# Patient Record
Sex: Female | Born: 1935 | Race: White | Hispanic: No | Smoking: Former smoker
Health system: Southern US, Community
[De-identification: ages and names within clinical notes are randomized; demographics above are authoritative.]

## PROBLEM LIST (undated history)

## (undated) DIAGNOSIS — R1310 Dysphagia, unspecified: Secondary | ICD-10-CM

## (undated) DIAGNOSIS — I219 Acute myocardial infarction, unspecified: Secondary | ICD-10-CM

## (undated) DIAGNOSIS — F0390 Unspecified dementia without behavioral disturbance: Secondary | ICD-10-CM

## (undated) DIAGNOSIS — K219 Gastro-esophageal reflux disease without esophagitis: Secondary | ICD-10-CM

## (undated) DIAGNOSIS — I10 Essential (primary) hypertension: Secondary | ICD-10-CM

## (undated) DIAGNOSIS — H3530 Unspecified macular degeneration: Secondary | ICD-10-CM

## (undated) DIAGNOSIS — I878 Other specified disorders of veins: Secondary | ICD-10-CM

## (undated) DIAGNOSIS — R32 Unspecified urinary incontinence: Secondary | ICD-10-CM

## (undated) DIAGNOSIS — I2510 Atherosclerotic heart disease of native coronary artery without angina pectoris: Secondary | ICD-10-CM

## (undated) HISTORY — DX: Atherosclerotic heart disease of native coronary artery without angina pectoris: I25.10

## (undated) HISTORY — DX: Gastro-esophageal reflux disease without esophagitis: K21.9

## (undated) HISTORY — DX: Dysphagia, unspecified: R13.10

## (undated) HISTORY — PX: CORONARY ANGIOPLASTY WITH STENT PLACEMENT: SHX49

## (undated) HISTORY — DX: Unspecified dementia without behavioral disturbance: F03.90

---

## 2017-03-28 LAB — BASIC METABOLIC PANEL WITH GFR
BUN: 18 (ref 4–21)
Creatinine: 0.9 (ref 0.5–1.1)
Glucose: 93
Potassium: 4.6 (ref 3.4–5.3)
Sodium: 130 — AB (ref 137–147)

## 2017-03-28 LAB — CBC AND DIFFERENTIAL
HCT: 40 (ref 36–46)
Hemoglobin: 13.4 (ref 12.0–16.0)
Platelets: 231 (ref 150–399)
WBC: 8

## 2017-05-27 ENCOUNTER — Encounter (HOSPITAL_BASED_OUTPATIENT_CLINIC_OR_DEPARTMENT_OTHER): Payer: Self-pay

## 2017-05-27 ENCOUNTER — Emergency Department (HOSPITAL_BASED_OUTPATIENT_CLINIC_OR_DEPARTMENT_OTHER): Payer: Medicare Other

## 2017-05-27 ENCOUNTER — Emergency Department (HOSPITAL_BASED_OUTPATIENT_CLINIC_OR_DEPARTMENT_OTHER)
Admission: EM | Admit: 2017-05-27 | Discharge: 2017-05-27 | Disposition: A | Discharge status: Home / Self Care | Payer: Medicare Other | Source: Home / Self Care | Attending: Emergency Medicine | Admitting: Emergency Medicine

## 2017-05-27 DIAGNOSIS — Z87891 Personal history of nicotine dependence: Secondary | ICD-10-CM | POA: Insufficient documentation

## 2017-05-27 DIAGNOSIS — Z7982 Long term (current) use of aspirin: Secondary | ICD-10-CM

## 2017-05-27 DIAGNOSIS — Z79899 Other long term (current) drug therapy: Secondary | ICD-10-CM | POA: Insufficient documentation

## 2017-05-27 DIAGNOSIS — R4182 Altered mental status, unspecified: Secondary | ICD-10-CM

## 2017-05-27 DIAGNOSIS — N390 Urinary tract infection, site not specified: Secondary | ICD-10-CM | POA: Insufficient documentation

## 2017-05-27 DIAGNOSIS — R05 Cough: Secondary | ICD-10-CM

## 2017-05-27 DIAGNOSIS — I10 Essential (primary) hypertension: Secondary | ICD-10-CM

## 2017-05-27 DIAGNOSIS — A419 Sepsis, unspecified organism: Secondary | ICD-10-CM | POA: Diagnosis not present

## 2017-05-27 HISTORY — DX: Essential (primary) hypertension: I10

## 2017-05-27 HISTORY — DX: Acute myocardial infarction, unspecified: I21.9

## 2017-05-27 HISTORY — DX: Unspecified macular degeneration: H35.30

## 2017-05-27 HISTORY — DX: Unspecified urinary incontinence: R32

## 2017-05-27 LAB — COMPREHENSIVE METABOLIC PANEL
ALBUMIN: 3.9 g/dL (ref 3.5–5.0)
ALK PHOS: 82 U/L (ref 38–126)
ALT: 17 U/L (ref 14–54)
ANION GAP: 9 (ref 5–15)
AST: 27 U/L (ref 15–41)
BUN: 18 mg/dL (ref 6–20)
CO2: 24 mmol/L (ref 22–32)
Calcium: 10 mg/dL (ref 8.9–10.3)
Chloride: 98 mmol/L — ABNORMAL LOW (ref 101–111)
Creatinine, Ser: 0.77 mg/dL (ref 0.44–1.00)
GFR calc Af Amer: >60 mL/min (ref >60)
GLUCOSE: 141 mg/dL — AB (ref 65–99)
Potassium: 4.3 mmol/L (ref 3.5–5.1)
Sodium: 131 mmol/L — ABNORMAL LOW (ref 135–145)
Total Bilirubin: 0.5 mg/dL (ref 0.3–1.2)
Total Protein: 7.3 g/dL (ref 6.5–8.1)

## 2017-05-27 LAB — CBC WITH DIFFERENTIAL/PLATELET
BASOS PCT: 0 %
Basophils Absolute: 0 10*3/uL (ref 0.0–0.1)
Eosinophils Absolute: 0 10*3/uL (ref 0.0–0.7)
Eosinophils Relative: 0 %
HEMATOCRIT: 41.2 % (ref 36.0–46.0)
HEMOGLOBIN: 14.3 g/dL (ref 12.0–15.0)
LYMPHS PCT: 9 %
Lymphs Abs: 1 10*3/uL (ref 0.7–4.0)
MCH: 32.1 pg (ref 26.0–34.0)
MCHC: 34.7 g/dL (ref 30.0–36.0)
MCV: 92.4 fL (ref 78.0–100.0)
MONO ABS: 1.1 10*3/uL — AB (ref 0.1–1.0)
MONOS PCT: 10 %
NEUTROS ABS: 9 10*3/uL — AB (ref 1.7–7.7)
NEUTROS PCT: 81 %
Platelets: 180 10*3/uL (ref 150–400)
RBC: 4.46 MIL/uL (ref 3.87–5.11)
RDW: 13.6 % (ref 11.5–15.5)
WBC: 11.1 10*3/uL — ABNORMAL HIGH (ref 4.0–10.5)

## 2017-05-27 LAB — URINALYSIS, MICROSCOPIC (REFLEX)

## 2017-05-27 LAB — URINALYSIS, ROUTINE W REFLEX MICROSCOPIC
BILIRUBIN URINE: NEGATIVE
GLUCOSE, UA: NEGATIVE mg/dL
KETONES UR: 15 mg/dL — AB
Nitrite: POSITIVE — AB
Protein, ur: 100 mg/dL — AB
Specific Gravity, Urine: 1.019 (ref 1.005–1.030)
pH: 5.5 (ref 5.0–8.0)

## 2017-05-27 LAB — I-STAT CG4 LACTIC ACID, ED: LACTIC ACID, VENOUS: 0.75 mmol/L (ref 0.5–1.9)

## 2017-05-27 MED ORDER — IBUPROFEN 400 MG PO TABS
400.0000 mg | ORAL_TABLET | Freq: Once | ORAL | Status: AC
  Administered 2017-05-27: 400 mg via ORAL

## 2017-05-27 MED ORDER — CEPHALEXIN 500 MG PO CAPS: 500.0000 mg | ORAL_CAPSULE | Freq: Two times a day (BID) | ORAL | 0 refills | Status: DC

## 2017-05-27 MED ORDER — IBUPROFEN 400 MG PO TABS
ORAL_TABLET | ORAL | Status: AC
  Filled 2017-05-27: qty 1

## 2017-05-27 MED ORDER — ACETAMINOPHEN 325 MG PO TABS
650.0000 mg | ORAL_TABLET | Freq: Once | ORAL | Status: AC
  Administered 2017-05-27: 650 mg via ORAL
  Filled 2017-05-27: qty 2

## 2017-05-27 MED ORDER — DEXTROSE 5 % IV SOLN
1.0000 g | Freq: Once | INTRAVENOUS | Status: AC
  Administered 2017-05-27: 1 g via INTRAVENOUS
  Filled 2017-05-27: qty 10

## 2017-05-27 MED ORDER — SODIUM CHLORIDE 0.9 % IV BOLUS (SEPSIS)
500.0000 mL | Freq: Once | INTRAVENOUS | Status: AC
  Administered 2017-05-27: 500 mL via INTRAVENOUS

## 2017-05-27 NOTE — Discharge Instructions (Signed)
Please read and follow all provided instructions.  Your diagnoses today include:  1. Urinary tract infection without hematuria, site unspecified    Tests performed today include: Urine test - suggests that you have an infection in your bladder Vital signs. See below for your results today.   Medications prescribed:  Take as prescribed   Home care instructions:  Follow any educational materials contained in this packet.  Follow-up instructions: Please follow-up with your primary care provider in 3 days if symptoms are not resolved for further evaluation of your symptoms.  Return instructions:  Please return to the Emergency Department if you experience worsening symptoms.  Return with fever, worsening pain, persistent vomiting, worsening pain in your back.  Please return if you have any other emergent concerns.  Additional Information:  Your vital signs today were: BP (!) 125/52 (BP Location: Left Arm)    Pulse 78    Temp 99.5 F (37.5 C) (Oral)    Resp 20    Ht 5\' 2"  (1.575 m)    Wt 54.4 kg (120 lb)    SpO2 95%    BMI 21.95 kg/m  If your blood pressure (BP) was elevated above 135/85 this visit, please have this repeated by your doctor within one month. --------------

## 2017-05-27 NOTE — ED Notes (Signed)
ED Provider at bedside. 

## 2017-05-27 NOTE — ED Triage Notes (Signed)
Per grandson pt with confusion last night and today-pt A/O x 3-NAD-states pt with recent viral infection/prod cough-took mucinex-no abx-presents to triage in own w/c scooter

## 2017-05-27 NOTE — ED Notes (Signed)
ED Provider at bedside, Dr. Belfi 

## 2017-05-27 NOTE — ED Provider Notes (Signed)
[facility]   CSN: [id number] Arrival date & time: 05/27/17  2056  By signing my name below, I, Sara Clark, attest that this documentation has been prepared under the direction and in the presence of Audry Piliyler Emmajo Bennette, PA-C. Electronically Signed: Karren CobbleNy'Kea Clark, ED Scribe. 05/27/17. 10:43 PM.  History   Chief Complaint Chief Complaint  Patient presents with  . Altered Mental Status   The history is provided by the patient and a relative. No language interpreter was used.    HPI Comments: Sara Clark is a 83 y.o. female with a PMHx of HTN and MI, who presents to the Emergency Department complaining of sudden onset of confusion that started last night. Pt notes associated lower quadrant abdominal pain and a cough (white sputum production). Per pt's grandson she has had a viral infection they have been treating with Mucinex, which her symptoms were improving. Last night when speaking with her he notes she seemed to be slightly confused. She also reports to him she slid when trying to get out of the bed and was unable to get herself up. She was assisted up by the staff. At this moment she notes she is not feeling well. Denies dysuria, back pain, or any other acute associated symptoms at this time.    Past Medical History:  Diagnosis Date  . Hypertension   . Macular degeneration   . Myocardial infarct (HCC)   . Urinary incontinence     There are no active problems to display for this patient.   Past Surgical History:  Procedure Laterality Date  . CORONARY ANGIOPLASTY WITH STENT PLACEMENT      OB History    No data available       Home Medications    Prior to Admission medications   Medication Sig Start Date End Date Taking? Authorizing Provider  amLODipine (NORVASC) 5 MG tablet Take 5 mg by mouth daily.   Yes [provider]  aspirin EC 81 MG tablet Take 81 mg by mouth every 3 (three) days.   Yes [provider]  atenolol (TENORMIN)  25 MG tablet Take by mouth 2 (two) times daily.   Yes [provider]  oxybutynin (DITROPAN) 5 MG tablet Take 5 mg by mouth 2 (two) times daily.   Yes [provider]    Family History No family history on file.  Social History Social History  Substance Use Topics  . Smoking status: Former Games developermoker  . Smokeless tobacco: Never Used  . Alcohol use No     Allergies   Erythromycin and Penicillins   Review of Systems Review of Systems  Constitutional:       Unusual confusion.  Respiratory: Positive for cough.   Gastrointestinal: Positive for abdominal pain.  Genitourinary: Negative for dysuria.  Musculoskeletal: Negative for back pain.   A complete 10 system review of systems was obtained and all systems are negative except as noted in the HPI and PMH.   Physical Exam Updated Vital Signs BP (!) 142/48 (BP Location: Right Arm)   Pulse 79   Temp (!) 100.9 F (38.3 C) (Oral)   Resp 16   Ht 5\' 2"  (1.575 m)   Wt 120 lb (54.4 kg)   SpO2 96%   BMI 21.95 kg/m   Physical Exam  Constitutional: She is oriented to person, place, and time. She appears well-developed and well-nourished. No distress.  HENT:  Head: Normocephalic and atraumatic.  Right Ear: Tympanic membrane, external ear and ear canal  normal.  Left Ear: Tympanic membrane, external ear and ear canal normal.  Nose: Nose normal.  Mouth/Throat: Uvula is midline, oropharynx is clear and moist and mucous membranes are normal. No trismus in the jaw. No oropharyngeal exudate, posterior oropharyngeal erythema or tonsillar abscesses.  Eyes: EOM are normal. Pupils are equal, round, and reactive to light.  Neck: Normal range of motion. Neck supple. No tracheal deviation present.  Cardiovascular: Normal rate, regular rhythm, S1 normal, S2 normal, normal heart sounds, intact distal pulses and normal pulses.   Pulmonary/Chest: Effort normal and breath sounds normal. No respiratory distress. She has no decreased  breath sounds. She has no wheezes. She has no rhonchi. She has no rales.  Abdominal: Soft. Normal appearance and bowel sounds are normal. She exhibits no distension. There is tenderness.  Tenderness with palpation of the suprapubic region.   Musculoskeletal: Normal range of motion.  Neurological: She is alert and oriented to person, place, and time.  Skin: Skin is warm and dry.  Psychiatric: She has a normal mood and affect. Her speech is normal and behavior is normal. Thought content normal.  Nursing note and vitals reviewed.   ED Treatments / Results  DIAGNOSTIC STUDIES: Oxygen Saturation is 96% on RA, adequate by my interpretation.   COORDINATION OF CARE: 10:25 PM-Discussed next steps with pt. Pt verbalized understanding and is agreeable with the plan.   Labs (all labs ordered are listed, but only abnormal results are displayed) Labs Reviewed  CBC WITH DIFFERENTIAL/PLATELET - Abnormal; Notable for the following:       Result Value   WBC 11.1 (*)    Neutro Abs 9.0 (*)    Monocytes Absolute 1.1 (*)    All other components within normal limits  COMPREHENSIVE METABOLIC PANEL - Abnormal; Notable for the following:    Sodium 131 (*)    Chloride 98 (*)    Glucose, Bld 141 (*)    All other components within normal limits  URINALYSIS, ROUTINE W REFLEX MICROSCOPIC - Abnormal; Notable for the following:    APPearance TURBID (*)    Hgb urine dipstick MODERATE (*)    Ketones, ur 15 (*)    Protein, ur 100 (*)    Nitrite POSITIVE (*)    Leukocytes, UA LARGE (*)    All other components within normal limits  URINALYSIS, MICROSCOPIC (REFLEX) - Abnormal; Notable for the following:    Bacteria, UA MANY (*)    Squamous Epithelial / LPF 0-5 (*)    All other components within normal limits  URINE CULTURE  I-STAT CG4 LACTIC ACID, ED    EKG  EKG Interpretation None       Radiology Dg Chest 2 View  Result Date: 05/27/2017 CLINICAL DATA:  Cough. EXAM: CHEST  2 VIEW COMPARISON:   None. FINDINGS: The heart size and mediastinal contours are within normal limits. Atherosclerosis of thoracic aorta is noted. No pneumothorax or pleural effusion is noted. Mild bibasilar atelectasis or scarring is noted. The visualized skeletal structures are unremarkable. IMPRESSION: Aortic atherosclerosis. Mild bibasilar subsegmental atelectasis or scarring. Electronically Signed   By: Lupita Raider, M.D.   On: 05/27/2017 21:49    Procedures Procedures (including critical care time)  Medications Ordered in ED Medications  ibuprofen (ADVIL,MOTRIN) tablet 400 mg (400 mg Oral Given 05/27/17 2219)  acetaminophen (TYLENOL) tablet 650 mg (650 mg Oral Given 05/27/17 2247)  sodium chloride 0.9 % bolus 500 mL (0 mLs Intravenous Stopped 05/27/17 2327)  cefTRIAXone (ROCEPHIN) 1 g in dextrose 5 %  50 mL IVPB (0 g Intravenous Stopped 05/27/17 2326)   Initial Impression / Assessment and Plan / ED Course  I have reviewed the triage vital signs and the nursing notes.  Pertinent labs & imaging results that were available during my care of the patient were reviewed by me and considered in my medical decision making (see chart for details).  Final Clinical Impressions(s) / ED Diagnoses  I have reviewed and evaluated the relevant laboratory values I have reviewed and evaluated the relevant imaging studies.  I have reviewed the relevant previous healthcare records. I obtained HPI from historian. Patient discussed with supervising physician  ED Course:  Assessment: Pt is a 83 y.o. female with hx  HTN and MI who presents with reported confusion as well as generalized weakness. Notes occurrence yesterday. Per patient, fighting viral URI with family who has similar. No N/V. No CP/SOB. Endorse ABD pain suprapubic region. No dysuria. On exam, pt in Nontoxic/nonseptic appearing. VSS. Temp 100.53F. Lungs CTA. Heart RRR. Abdomen TTP suprapubiclly. iStat Lactate 0.75. WBC 11.1. CXR unremarkable. UA shows UTI. Culture sent.  Given Rocephin in ED as well as 500 NS bolus. Seen by supervising physician. Will DC home with Dx UTI. Given Keflex. Strict return precautions given. Pt with son who is radiologist and is ok for DC. Will watch at home. Plan is to DC home. At time of discharge, Patient is in no acute distress. Vital Signs are stable. Patient is able to ambulate. Patient able to tolerate PO.   Disposition/Plan:  DC Home Additional Verbal discharge instructions given and discussed with patient.  Pt Instructed to f/u with PCP in the next week for evaluation and treatment of symptoms. Return precautions given Pt acknowledges and agrees with plan  Supervising Physician Rolan Bucco, MD   Final diagnoses:  Urinary tract infection without hematuria, site unspecified    New Prescriptions New Prescriptions   No medications on file   I personally performed the services described in this documentation, which was scribed in my presence. The recorded information has been reviewed and is accurate.    Audry Pili, PA-C 05/27/17 2332    Rolan Bucco, MD 05/27/17 [phone]

## 2017-05-28 ENCOUNTER — Inpatient Hospital Stay (HOSPITAL_COMMUNITY)
Admission: EM | Admit: 2017-05-28 | Discharge: 2017-06-03 | DRG: 871 | Disposition: A | Discharge status: Skilled Nursing Facility | Payer: Medicare Other | Attending: Internal Medicine | Admitting: Internal Medicine

## 2017-05-28 DIAGNOSIS — Z66 Do not resuscitate: Secondary | ICD-10-CM | POA: Diagnosis present

## 2017-05-28 DIAGNOSIS — I2510 Atherosclerotic heart disease of native coronary artery without angina pectoris: Secondary | ICD-10-CM | POA: Diagnosis present

## 2017-05-28 DIAGNOSIS — R0602 Shortness of breath: Secondary | ICD-10-CM

## 2017-05-28 DIAGNOSIS — J189 Pneumonia, unspecified organism: Secondary | ICD-10-CM | POA: Diagnosis present

## 2017-05-28 DIAGNOSIS — Z87891 Personal history of nicotine dependence: Secondary | ICD-10-CM

## 2017-05-28 DIAGNOSIS — J181 Lobar pneumonia, unspecified organism: Secondary | ICD-10-CM

## 2017-05-28 DIAGNOSIS — Z6824 Body mass index (BMI) 24.0-24.9, adult: Secondary | ICD-10-CM

## 2017-05-28 DIAGNOSIS — G9341 Metabolic encephalopathy: Secondary | ICD-10-CM | POA: Diagnosis present

## 2017-05-28 DIAGNOSIS — Z79899 Other long term (current) drug therapy: Secondary | ICD-10-CM

## 2017-05-28 DIAGNOSIS — G9349 Other encephalopathy: Secondary | ICD-10-CM

## 2017-05-28 DIAGNOSIS — J690 Pneumonitis due to inhalation of food and vomit: Secondary | ICD-10-CM | POA: Diagnosis present

## 2017-05-28 DIAGNOSIS — K8020 Calculus of gallbladder without cholecystitis without obstruction: Secondary | ICD-10-CM | POA: Diagnosis present

## 2017-05-28 DIAGNOSIS — I252 Old myocardial infarction: Secondary | ICD-10-CM

## 2017-05-28 DIAGNOSIS — E440 Moderate protein-calorie malnutrition: Secondary | ICD-10-CM | POA: Diagnosis present

## 2017-05-28 DIAGNOSIS — N3000 Acute cystitis without hematuria: Secondary | ICD-10-CM

## 2017-05-28 DIAGNOSIS — R5381 Other malaise: Secondary | ICD-10-CM

## 2017-05-28 DIAGNOSIS — K224 Dyskinesia of esophagus: Secondary | ICD-10-CM | POA: Diagnosis present

## 2017-05-28 DIAGNOSIS — B999 Unspecified infectious disease: Secondary | ICD-10-CM

## 2017-05-28 DIAGNOSIS — A419 Sepsis, unspecified organism: Principal | ICD-10-CM | POA: Diagnosis present

## 2017-05-28 DIAGNOSIS — Z955 Presence of coronary angioplasty implant and graft: Secondary | ICD-10-CM

## 2017-05-28 DIAGNOSIS — H3530 Unspecified macular degeneration: Secondary | ICD-10-CM | POA: Diagnosis present

## 2017-05-28 DIAGNOSIS — I10 Essential (primary) hypertension: Secondary | ICD-10-CM | POA: Diagnosis present

## 2017-05-28 DIAGNOSIS — Z7982 Long term (current) use of aspirin: Secondary | ICD-10-CM

## 2017-05-28 DIAGNOSIS — I159 Secondary hypertension, unspecified: Secondary | ICD-10-CM

## 2017-05-28 DIAGNOSIS — R109 Unspecified abdominal pain: Secondary | ICD-10-CM | POA: Diagnosis present

## 2017-05-28 DIAGNOSIS — R911 Solitary pulmonary nodule: Secondary | ICD-10-CM | POA: Diagnosis present

## 2017-05-28 DIAGNOSIS — N390 Urinary tract infection, site not specified: Secondary | ICD-10-CM | POA: Diagnosis present

## 2017-05-28 DIAGNOSIS — K219 Gastro-esophageal reflux disease without esophagitis: Secondary | ICD-10-CM | POA: Diagnosis present

## 2017-05-28 DIAGNOSIS — B9620 Unspecified Escherichia coli [E. coli] as the cause of diseases classified elsewhere: Secondary | ICD-10-CM | POA: Diagnosis present

## 2017-05-28 HISTORY — DX: Other specified disorders of veins: I87.8

## 2017-05-28 LAB — I-STAT CG4 LACTIC ACID, ED: Lactic Acid, Venous: 0.94 mmol/L (ref 0.5–1.9)

## 2017-05-28 NOTE — ED Notes (Signed)
Bed: [id number] Expected date:  Expected time:  Means of arrival:  Comments: [id number] yo F/ Abd pain-uti

## 2017-05-28 NOTE — ED Triage Notes (Signed)
Per EMS pt has complaints of lower abdominal pain which started a few hours ago. Pt has Hx of UTI. [facility], started antibiotics today. Pt family reports increased confusion.

## 2017-05-28 NOTE — ED Provider Notes (Signed)
[facility]-EM[facility]ENCY DEPT Provider Note   CSN: [id number] Arrival date & time: 05/28/17  2212   By signing my name below, I, [name], attest that this documentation has been prepared under the direction and in the presence of [employer], PA-C. Electronically Signed: Karren Cobble, ED Scribe. 05/28/17. 11:13 PM.  History   Chief Complaint Chief Complaint  Patient presents with  . Abdominal Pain   The history is provided by the patient and a relative. No language interpreter was used.    HPI Comments: Sara Clark is a 83 y.o. female brought in by ambulance, who presents to the Emergency Department complaining of gradually worsening generalized weakness. Notes a productive cough, and intermittent left lower quadrant abdominal pain. She has been coughing up yellow sputum today but previously she was producing white sputum. Pt was seen in the ED yesterday with similar symptoms with her grandson, and she was dx with a UTI and given an abx. Today her grandson notes she has continued to decline from her normal baseline. She has been generally weaker and unable to ambulate on her own. Pt denies having a bowel movement today. Denies nausea, vomiting, or fever.  Past Medical History:  Diagnosis Date  . Hypertension   . Macular degeneration   . Myocardial infarct (HCC)   . Urinary incontinence     There are no active problems to display for this patient.   Past Surgical History:  Procedure Laterality Date  . CORONARY ANGIOPLASTY WITH STENT PLACEMENT      OB History    No data available       Home Medications    Prior to Admission medications   Medication Sig Start Date End Date Taking? Authorizing Provider  amLODipine (NORVASC) 5 MG tablet Take 5 mg by mouth daily.   Yes [provider]  aspirin EC 81 MG tablet Take 81 mg by mouth every 3 (three) days.   Yes [provider]  atenolol (TENORMIN) 25 MG tablet Take by mouth 2 (two) times daily.   Yes [provider]  cephALEXin (KEFLEX) 500 MG capsule Take 1 capsule (500 mg total) by mouth 2 (two) times daily. 05/27/17 06/03/17 Yes Audry Pili, PA-C  Multiple Vitamin (MULTIVITAMIN WITH MINERALS) TABS tablet Take 1 tablet by mouth daily.   Yes [provider]  multivitamin-lutein (OCUVITE-LUTEIN) CAPS capsule Take 1 capsule by mouth daily.   Yes [provider]  oxybutynin (DITROPAN) 5 MG tablet Take 5 mg by mouth 2 (two) times daily.   Yes [provider]  senna (SENOKOT) 8.6 MG TABS tablet Take 1 tablet by mouth 2 (two) times daily.   Yes [provider]    Family History No family history on file.  Social History Social History  Substance Use Topics  . Smoking status: Former Games developer  . Smokeless tobacco: Never Used  . Alcohol use No     Allergies   Erythromycin and Penicillins   Review of Systems Review of Systems  Constitutional: Negative for fever.  Gastrointestinal: Positive for abdominal pain. Negative for nausea and vomiting.  Neurological: Positive for weakness.  A complete 10 system review of systems was obtained and all systems are negative except as noted in the HPI and PMH.    Physical Exam Updated Vital Signs BP (!) 133/105 (BP Location: Right Arm)   Pulse 73   Temp 99.2 F (37.3 C) (Oral)   Resp 18   SpO2 92%   Physical Exam  Constitutional: She is oriented  to person, place, and time. She appears well-developed and well-nourished. No distress.  Nontoxic and in no acute distress  HENT:  Head: Normocephalic and atraumatic.  Eyes: Conjunctivae and EOM are normal. No scleral icterus.  Neck: Normal range of motion.  No meningismus  Cardiovascular: Normal rate, regular rhythm and intact distal pulses.   Pulmonary/Chest: Effort normal. No respiratory distress. She has no wheezes.  Diffuse expiratory rhonchi, worse in bilateral lower lobes. Faint wheeze in the anterior right lower lobe. Chest expansion symmetric.  Abdominal:  Soft. She exhibits no distension. There is no guarding.  Minimal left lower quadrant tenderness. No abdominal distention. No masses or peritoneal signs. Abdomen soft.  Musculoskeletal: Normal range of motion.  Neurological: She is alert and oriented to person, place, and time. She exhibits normal muscle tone. Coordination normal.  GCS 15. Speech is goal oriented. Patient answers questions appropriately and follows commands.   Skin: Skin is warm and dry. No rash noted. She is not diaphoretic. No erythema. No pallor.  Psychiatric: She has a normal mood and affect. Her behavior is normal.  Nursing note and vitals reviewed.    ED Treatments / Results  DIAGNOSTIC STUDIES: Oxygen Saturation is 92% on RA, low by my interpretation.   COORDINATION OF CARE: 10:49 PM-Discussed next steps with pt. Pt verbalized understanding and is agreeable with the plan.   Labs (all labs ordered are listed, but only abnormal results are displayed) Labs Reviewed  CBC WITH DIFFERENTIAL/PLATELET - Abnormal; Notable for the following:       Result Value   WBC 14.3 (*)    Neutro Abs 11.9 (*)    Monocytes Absolute 1.1 (*)    All other components within normal limits  COMPREHENSIVE METABOLIC PANEL - Abnormal; Notable for the following:    Sodium 131 (*)    Chloride 100 (*)    Glucose, Bld 122 (*)    Albumin 3.3 (*)    All other components within normal limits  CULTURE, BLOOD (ROUTINE X 2)  CULTURE, BLOOD (ROUTINE X 2)  CULTURE, EXPECTORATED SPUTUM-ASSESSMENT  I-STAT CG4 LACTIC ACID, ED    EKG  EKG Interpretation None       Radiology Dg Chest 2 View  Result Date: 05/29/2017 CLINICAL DATA:  83 year old female with productive cough for about a week and fever. EXAM: CHEST  2 VIEW COMPARISON:  Chest radiograph dated 05/27/2017 FINDINGS: There is shallow inspiration. There are hazy bibasilar densities, right greater than left which appear more prominent and more confluent compared to the prior radiograph.  This may represent atelectatic changes/scarring appearing more prominent secondary to decreased inspiratory effort compared to prior radiograph. However, developing infiltrate is not excluded. Correlation with clinical exam is recommended. There is no pleural effusion or pneumothorax. Probable mild bronchiectatic changes. The cardiac silhouette is within normal limits. Coronary vascular stent noted. There is atherosclerotic calcification of the thoracic aorta. There is osteopenia with degenerative changes of the spine. No acute osseous pathology. IMPRESSION: Hazy bibasilar densities, right greater than left may represent atelectatic changes, although developing infiltrate is not excluded. Correlation with clinical exam recommended. Electronically Signed   By: Elgie Collard M.D.   On: 05/29/2017 00:34   Dg Chest 2 View  Result Date: 05/27/2017 CLINICAL DATA:  Cough. EXAM: CHEST  2 VIEW COMPARISON:  None. FINDINGS: The heart size and mediastinal contours are within normal limits. Atherosclerosis of thoracic aorta is noted. No pneumothorax or pleural effusion is noted. Mild bibasilar atelectasis or scarring is noted. The visualized skeletal  structures are unremarkable. IMPRESSION: Aortic atherosclerosis. Mild bibasilar subsegmental atelectasis or scarring. Electronically Signed   By: Lupita RaiderJames  Green Jr, M.D.   On: 05/27/2017 21:49    Procedures Procedures (including critical care time)  Medications Ordered in ED Medications  doxycycline (VIBRAMYCIN) 100 mg in dextrose 5 % 250 mL IVPB (not administered)  cefTRIAXone (ROCEPHIN) 1 g in dextrose 5 % 50 mL IVPB (1 g Intravenous New Bag/Given 05/29/17 0021)     Initial Impression / Assessment and Plan / ED Course  I have reviewed the triage vital signs and the nursing notes.  Pertinent labs & imaging results that were available during my care of the patient were reviewed by me and considered in my medical decision making (see chart for details).      83 year old female presents to the emergency department for persistent generalized weakness as well as intermittent confusion. She was diagnosed with a urinary tract infection yesterday. Culture pending. Patient covered with Rocephin. Repeat chest x-ray performed. This shows concern for developing early community-acquired pneumonia. Given worsening clinical condition and age, will admit for further management of CAP and UTI. Case [facility].   Lucila MaineGrandson is Dr. Llana [facility]Entrikin of [facility].   Final Clinical Impressions(s) / ED Diagnoses   Final diagnoses:  Encephalopathy due to infection  Acute cystitis without hematuria  Community acquired pneumonia, unspecified laterality    New Prescriptions New Prescriptions   No medications on file    I personally performed the services described in this documentation, which was scribed in my presence. The recorded information has been reviewed and is accurate.       Antony MaduraHumes, Byanca Kasper, PA-C 05/29/17 0124    Rolan BuccoBelfi, Melanie, MD 05/31/17 [phone]

## 2017-05-29 ENCOUNTER — Encounter (HOSPITAL_COMMUNITY): Payer: Self-pay | Admitting: Internal Medicine

## 2017-05-29 ENCOUNTER — Emergency Department (HOSPITAL_COMMUNITY): Payer: Medicare Other

## 2017-05-29 ENCOUNTER — Inpatient Hospital Stay (HOSPITAL_COMMUNITY): Payer: Medicare Other

## 2017-05-29 DIAGNOSIS — K224 Dyskinesia of esophagus: Secondary | ICD-10-CM | POA: Diagnosis present

## 2017-05-29 DIAGNOSIS — K219 Gastro-esophageal reflux disease without esophagitis: Secondary | ICD-10-CM | POA: Diagnosis not present

## 2017-05-29 DIAGNOSIS — E440 Moderate protein-calorie malnutrition: Secondary | ICD-10-CM | POA: Diagnosis not present

## 2017-05-29 DIAGNOSIS — G9349 Other encephalopathy: Secondary | ICD-10-CM | POA: Diagnosis not present

## 2017-05-29 DIAGNOSIS — Z7982 Long term (current) use of aspirin: Secondary | ICD-10-CM | POA: Diagnosis not present

## 2017-05-29 DIAGNOSIS — I5030 Unspecified diastolic (congestive) heart failure: Secondary | ICD-10-CM | POA: Diagnosis not present

## 2017-05-29 DIAGNOSIS — Z79899 Other long term (current) drug therapy: Secondary | ICD-10-CM | POA: Diagnosis not present

## 2017-05-29 DIAGNOSIS — J181 Lobar pneumonia, unspecified organism: Secondary | ICD-10-CM | POA: Diagnosis not present

## 2017-05-29 DIAGNOSIS — N3000 Acute cystitis without hematuria: Secondary | ICD-10-CM | POA: Diagnosis not present

## 2017-05-29 DIAGNOSIS — R109 Unspecified abdominal pain: Secondary | ICD-10-CM | POA: Diagnosis present

## 2017-05-29 DIAGNOSIS — I2510 Atherosclerotic heart disease of native coronary artery without angina pectoris: Secondary | ICD-10-CM | POA: Diagnosis present

## 2017-05-29 DIAGNOSIS — R0602 Shortness of breath: Secondary | ICD-10-CM | POA: Diagnosis not present

## 2017-05-29 DIAGNOSIS — N390 Urinary tract infection, site not specified: Secondary | ICD-10-CM | POA: Diagnosis present

## 2017-05-29 DIAGNOSIS — R5381 Other malaise: Secondary | ICD-10-CM | POA: Diagnosis not present

## 2017-05-29 DIAGNOSIS — Z66 Do not resuscitate: Secondary | ICD-10-CM | POA: Diagnosis present

## 2017-05-29 DIAGNOSIS — Z955 Presence of coronary angioplasty implant and graft: Secondary | ICD-10-CM | POA: Diagnosis not present

## 2017-05-29 DIAGNOSIS — Z87891 Personal history of nicotine dependence: Secondary | ICD-10-CM | POA: Diagnosis not present

## 2017-05-29 DIAGNOSIS — R911 Solitary pulmonary nodule: Secondary | ICD-10-CM | POA: Diagnosis present

## 2017-05-29 DIAGNOSIS — I10 Essential (primary) hypertension: Secondary | ICD-10-CM | POA: Diagnosis not present

## 2017-05-29 DIAGNOSIS — A419 Sepsis, unspecified organism: Principal | ICD-10-CM

## 2017-05-29 DIAGNOSIS — J189 Pneumonia, unspecified organism: Secondary | ICD-10-CM | POA: Diagnosis not present

## 2017-05-29 DIAGNOSIS — G9341 Metabolic encephalopathy: Secondary | ICD-10-CM | POA: Diagnosis present

## 2017-05-29 DIAGNOSIS — Z6824 Body mass index (BMI) 24.0-24.9, adult: Secondary | ICD-10-CM | POA: Diagnosis not present

## 2017-05-29 DIAGNOSIS — I252 Old myocardial infarction: Secondary | ICD-10-CM | POA: Diagnosis not present

## 2017-05-29 DIAGNOSIS — H3530 Unspecified macular degeneration: Secondary | ICD-10-CM | POA: Diagnosis not present

## 2017-05-29 DIAGNOSIS — K8020 Calculus of gallbladder without cholecystitis without obstruction: Secondary | ICD-10-CM | POA: Diagnosis present

## 2017-05-29 DIAGNOSIS — B9620 Unspecified Escherichia coli [E. coli] as the cause of diseases classified elsewhere: Secondary | ICD-10-CM | POA: Diagnosis not present

## 2017-05-29 DIAGNOSIS — J690 Pneumonitis due to inhalation of food and vomit: Secondary | ICD-10-CM | POA: Diagnosis not present

## 2017-05-29 DIAGNOSIS — B999 Unspecified infectious disease: Secondary | ICD-10-CM | POA: Diagnosis not present

## 2017-05-29 HISTORY — DX: Atherosclerotic heart disease of native coronary artery without angina pectoris: I25.10

## 2017-05-29 LAB — CBC WITH DIFFERENTIAL/PLATELET
Basophils Absolute: 0 10*3/uL (ref 0.0–0.1)
Basophils Relative: 0 %
Eosinophils Absolute: 0 10*3/uL (ref 0.0–0.7)
Eosinophils Relative: 0 %
HEMATOCRIT: 37.2 % (ref 36.0–46.0)
Hemoglobin: 13 g/dL (ref 12.0–15.0)
LYMPHS PCT: 9 %
Lymphs Abs: 1.2 10*3/uL (ref 0.7–4.0)
MCH: 31.6 pg (ref 26.0–34.0)
MCHC: 34.9 g/dL (ref 30.0–36.0)
MCV: 90.5 fL (ref 78.0–100.0)
MONOS PCT: 8 %
Monocytes Absolute: 1.1 10*3/uL — ABNORMAL HIGH (ref 0.1–1.0)
NEUTROS ABS: 11.9 10*3/uL — AB (ref 1.7–7.7)
NEUTROS PCT: 83 %
Platelets: 181 10*3/uL (ref 150–400)
RBC: 4.11 MIL/uL (ref 3.87–5.11)
RDW: 13.4 % (ref 11.5–15.5)
WBC: 14.3 10*3/uL — ABNORMAL HIGH (ref 4.0–10.5)

## 2017-05-29 LAB — PROCALCITONIN: Procalcitonin: <0.1 ng/mL

## 2017-05-29 LAB — COMPREHENSIVE METABOLIC PANEL
ALK PHOS: 80 U/L (ref 38–126)
ALT: 20 U/L (ref 14–54)
AST: 28 U/L (ref 15–41)
Albumin: 3.3 g/dL — ABNORMAL LOW (ref 3.5–5.0)
Anion gap: 7 (ref 5–15)
BUN: 13 mg/dL (ref 6–20)
CALCIUM: 9.5 mg/dL (ref 8.9–10.3)
CO2: 24 mmol/L (ref 22–32)
CREATININE: 0.73 mg/dL (ref 0.44–1.00)
Chloride: 100 mmol/L — ABNORMAL LOW (ref 101–111)
Glucose, Bld: 122 mg/dL — ABNORMAL HIGH (ref 65–99)
Potassium: 4.1 mmol/L (ref 3.5–5.1)
SODIUM: 131 mmol/L — AB (ref 135–145)
Total Bilirubin: 0.5 mg/dL (ref 0.3–1.2)
Total Protein: 6.7 g/dL (ref 6.5–8.1)

## 2017-05-29 LAB — LACTIC ACID, PLASMA: LACTIC ACID, VENOUS: 0.9 mmol/L (ref 0.5–1.9)

## 2017-05-29 LAB — STREP PNEUMONIAE URINARY ANTIGEN: STREP PNEUMO URINARY ANTIGEN: NEGATIVE

## 2017-05-29 LAB — INFLUENZA PANEL BY PCR (TYPE A & B)
INFLAPCR: NEGATIVE
INFLBPCR: NEGATIVE

## 2017-05-29 LAB — MRSA PCR SCREENING: MRSA by PCR: NEGATIVE

## 2017-05-29 LAB — LIPASE, BLOOD: LIPASE: 25 U/L (ref 11–51)

## 2017-05-29 MED ORDER — DM-GUAIFENESIN ER 30-600 MG PO TB12
1.0000 | ORAL_TABLET | Freq: Two times a day (BID) | ORAL | Status: DC
  Administered 2017-05-29 – 2017-06-03 (×11): 1 via ORAL
  Filled 2017-05-29 (×11): qty 1

## 2017-05-29 MED ORDER — PROSIGHT PO TABS
1.0000 | ORAL_TABLET | Freq: Every day | ORAL | Status: DC
  Administered 2017-05-29 – 2017-06-03 (×6): 1 via ORAL
  Filled 2017-05-29 (×6): qty 1

## 2017-05-29 MED ORDER — ASPIRIN EC 81 MG PO TBEC
81.0000 mg | DELAYED_RELEASE_TABLET | ORAL | Status: DC
  Administered 2017-05-29 – 2017-06-01 (×2): 81 mg via ORAL
  Filled 2017-05-29 (×3): qty 1

## 2017-05-29 MED ORDER — DEXTROSE 5 % IV SOLN
1.0000 g | INTRAVENOUS | Status: DC
  Administered 2017-05-29 – 2017-06-01 (×4): 1 g via INTRAVENOUS
  Filled 2017-05-29 (×5): qty 10

## 2017-05-29 MED ORDER — ENOXAPARIN SODIUM 40 MG/0.4ML ~~LOC~~ SOLN
40.0000 mg | SUBCUTANEOUS | Status: DC
  Administered 2017-05-29 – 2017-06-03 (×6): 40 mg via SUBCUTANEOUS
  Filled 2017-05-29 (×6): qty 0.4

## 2017-05-29 MED ORDER — ADULT MULTIVITAMIN W/MINERALS CH
1.0000 | ORAL_TABLET | Freq: Every day | ORAL | Status: DC
  Administered 2017-05-30 – 2017-06-03 (×5): 1 via ORAL
  Filled 2017-05-29 (×5): qty 1

## 2017-05-29 MED ORDER — IOPAMIDOL (ISOVUE-300) INJECTION 61%: 15.0000 mL | Freq: Once | INTRAVENOUS | Status: AC | PRN

## 2017-05-29 MED ORDER — IOPAMIDOL (ISOVUE-300) INJECTION 61%
INTRAVENOUS | Status: AC
  Filled 2017-05-29: qty 100

## 2017-05-29 MED ORDER — IOPAMIDOL (ISOVUE-300) INJECTION 61%
INTRAVENOUS | Status: AC
  Administered 2017-05-29: 08:00:00
  Filled 2017-05-29: qty 30

## 2017-05-29 MED ORDER — DEXTROSE 5 % IV SOLN
1.0000 g | Freq: Once | INTRAVENOUS | Status: AC
  Administered 2017-05-29: 1 g via INTRAVENOUS
  Filled 2017-05-29: qty 10

## 2017-05-29 MED ORDER — IOPAMIDOL (ISOVUE-300) INJECTION 61%
100.0000 mL | Freq: Once | INTRAVENOUS | Status: AC | PRN
  Administered 2017-05-29: 80 mL via INTRAVENOUS

## 2017-05-29 MED ORDER — SODIUM CHLORIDE 0.9 % IV SOLN
INTRAVENOUS | Status: DC
  Administered 2017-05-29 – 2017-05-30 (×2): via INTRAVENOUS

## 2017-05-29 MED ORDER — DEXTROSE 5 % IV SOLN
100.0000 mg | Freq: Once | INTRAVENOUS | Status: AC
  Administered 2017-05-29: 100 mg via INTRAVENOUS
  Filled 2017-05-29: qty 100

## 2017-05-29 MED ORDER — ALBUTEROL SULFATE (2.5 MG/3ML) 0.083% IN NEBU: 2.5000 mg | INHALATION_SOLUTION | RESPIRATORY_TRACT | Status: DC | PRN

## 2017-05-29 MED ORDER — METRONIDAZOLE IN NACL 5-0.79 MG/ML-% IV SOLN
500.0000 mg | Freq: Three times a day (TID) | INTRAVENOUS | Status: DC
  Administered 2017-05-29 – 2017-05-30 (×4): 500 mg via INTRAVENOUS
  Filled 2017-05-29 (×5): qty 100

## 2017-05-29 MED ORDER — DOXYCYCLINE HYCLATE 100 MG IV SOLR
100.0000 mg | Freq: Two times a day (BID) | INTRAVENOUS | Status: DC
  Filled 2017-05-29: qty 100

## 2017-05-29 MED ORDER — SENNA 8.6 MG PO TABS
1.0000 | ORAL_TABLET | Freq: Two times a day (BID) | ORAL | Status: DC
  Administered 2017-05-29 – 2017-06-03 (×11): 8.6 mg via ORAL
  Filled 2017-05-29 (×11): qty 1

## 2017-05-29 MED ORDER — ZOLPIDEM TARTRATE 5 MG PO TABS
5.0000 mg | ORAL_TABLET | Freq: Every evening | ORAL | Status: DC | PRN
Start: 2017-05-29 — End: 2017-05-29

## 2017-05-29 MED ORDER — ONDANSETRON HCL 4 MG/2ML IJ SOLN: 4.0000 mg | Freq: Three times a day (TID) | INTRAMUSCULAR | Status: DC | PRN

## 2017-05-29 MED ORDER — OXYBUTYNIN CHLORIDE 5 MG PO TABS
5.0000 mg | ORAL_TABLET | Freq: Two times a day (BID) | ORAL | Status: DC
Start: 2017-05-29 — End: 2017-06-03
  Administered 2017-05-29 – 2017-06-03 (×11): 5 mg via ORAL
  Filled 2017-05-29 (×11): qty 1

## 2017-05-29 MED ORDER — AMLODIPINE BESYLATE 5 MG PO TABS
5.0000 mg | ORAL_TABLET | Freq: Every day | ORAL | Status: DC
  Administered 2017-05-29 – 2017-06-03 (×6): 5 mg via ORAL
  Filled 2017-05-29 (×6): qty 1

## 2017-05-29 MED ORDER — ACETAMINOPHEN 325 MG PO TABS
650.0000 mg | ORAL_TABLET | Freq: Four times a day (QID) | ORAL | Status: DC | PRN
  Administered 2017-05-31: 650 mg via ORAL
  Filled 2017-05-29: qty 2

## 2017-05-29 MED ORDER — SODIUM CHLORIDE 0.9 % IV BOLUS (SEPSIS)
1000.0000 mL | Freq: Once | INTRAVENOUS | Status: AC
  Administered 2017-05-29: 1000 mL via INTRAVENOUS

## 2017-05-29 MED ORDER — ATENOLOL 25 MG PO TABS
25.0000 mg | ORAL_TABLET | Freq: Two times a day (BID) | ORAL | Status: DC
  Administered 2017-05-29 – 2017-06-03 (×10): 25 mg via ORAL
  Filled 2017-05-29 (×11): qty 1

## 2017-05-29 NOTE — Care Management Note (Signed)
Case Management Note  Patient Details  Name: Sara OaksLillie Satcher MRN: [id number] Date of Birth: 24-Aug-1920  Subjective/Objective:                  83 y.o. female with medical history significant of hypertension, CAD, stent placement, macular degeneration, urinary incontinence, who presents with altered mental status, cough and abdominal pain.  Per patient's grandson who is a radiologist, patient has been having cough for about 5 days.   She coughs up yellow colored sputum. She has mild SOB, does not have chest pain. She became confused yesterday. Patient also has left lower quadrant abdominal pain, but no nausea vomiting or diarrhea noted. No symptoms of UTI or unilateral weakness. [facility], and was diagnosed with UTI, and started with keflex. When I saw pt in ED, she is confused, but oriented to person and place, and answered some questions.  ED Course: pt was found to have positive urinalysis with large amount of leukocytes and positive nitrate, WBC 14.3, lactic acid 0.94, sodium 121, creatinine normal, temperature 100.9, no tachycardia, oxygen sats are 92 to 95% on RA. Chest x-ray showed possible bilateral basilar infiltration. Pt is admitted to tele bed as inpt.    Action/Plan: Home alone Will follow for case management needs  Expected Discharge Date:   [id number]               Expected Discharge Plan:  Home/Self Care  In-House Referral:     Discharge planning Services  CM Consult  Post Acute Care Choice:    Choice offered to:     DME Arranged:    DME Agency:     HH Arranged:    HH Agency:     Status of Service:  In process, will continue to follow  If discussed at Long Length of Stay Meetings, dates discussed:    Additional Comments:  Golda AcreDavis, Rhonda Lynn, RN 05/29/2017, 9:14 AM

## 2017-05-29 NOTE — Progress Notes (Signed)
Pharmacy Antibiotic Note  Sara OaksLillie Clark is a 83 y.o. female admitted on 05/28/2017 with pneumonia.  Pharmacy has been consulted for rocephin dosing.  Plan: Rocephin 1 gm IV q24h Rx will sign off as no further adjustments are necessary     Temp (24hrs), Avg:99.2 F (37.3 C), Min:99.2 F (37.3 C), Max:99.2 F (37.3 C)   Recent Labs Lab 05/27/17 2134 05/27/17 2242 05/28/17 2332  WBC 11.1*  --   --   CREATININE 0.77  --   --   LATICACIDVEN  --  0.75 0.94    Estimated Creatinine Clearance: 34 mL/min (by C-G formula based on SCr of 0.77 mg/dL).    Allergies  Allergen Reactions  . Erythromycin   . Penicillins     Antimicrobials this admission: 5/30 rocephin >>  5/30 doxy  >>   Dose adjustments this admission:   Microbiology results:  BCx:   UCx:    Sputum:    MRSA PCR:   Thank you for allowing pharmacy to be a part of this patient's care.  Lorenza EvangelistGreen, Emre Stock R 05/29/2017 12:11 AM

## 2017-05-29 NOTE — ED Notes (Signed)
Attempted to call report

## 2017-05-29 NOTE — H&P (Signed)
History and Physical    Sara Clark WGN:[id number] DOB: 08-24-1920 DOA: 05/28/2017  Referring MD/NP/PA:   PCP: Merlene Laughter, MD   Patient coming from:  The patient is coming from home.  At baseline, pt is independent for most of ADL.  Chief Complaint: Altered mental status, cough, abdominal pain  HPI: Sara Clark is a 83 y.o. female with medical history significant of hypertension, CAD, stent placement, macular degeneration, urinary incontinence, who presents with altered mental status, cough and abdominal pain.  Per patient's grandson who is a radiologist, patient has been having cough for about 5 days.   She coughs up yellow colored sputum. She has mild SOB, does not have chest pain. She became confused yesterday. Patient also has left lower quadrant abdominal pain, but no nausea vomiting or diarrhea noted. No symptoms of UTI or unilateral weakness. Pt was seen in [facility] ED, and was diagnosed with UTI, and started with keflex. When I saw pt in ED, she is confused, but oriented to person and place, and answered some questions.  ED Course: pt was found to have positive urinalysis with large amount of leukocytes and positive nitrate, WBC 14.3, lactic acid 0.94, sodium 121, creatinine normal, temperature 100.9, no tachycardia, oxygen sats are 92 to 95% on RA. Chest x-ray showed possible bilateral basilar infiltration. Pt is admitted to tele bed as inpt.   Review of Systems:   General: has fevers, no changes in body weight, has poor appetite, has fatigue HEENT: no blurry vision, hearing changes or sore throat Respiratory: has dyspnea, coughing, no wheezing CV: no chest pain, no palpitations GI: no nausea, vomiting, abdominal pain, diarrhea, constipation GU: no dysuria, burning on urination, increased urinary frequency, hematuria  Ext: no leg edema Neuro: no unilateral weakness, numbness, or tingling, no vision change or hearing loss. Has confusion. Skin: no rash, no skin tear. MSK:  No muscle spasm, no deformity, no limitation of range of movement in spin Heme: No easy bruising.  Travel history: No recent long distant travel.  Allergy:  Allergies  Allergen Reactions  . Erythromycin   . Penicillins     Past Medical History:  Diagnosis Date  . Hypertension   . Macular degeneration   . Myocardial infarct (HCC)   . Urinary incontinence   . Venous stasis     Past Surgical History:  Procedure Laterality Date  . CORONARY ANGIOPLASTY WITH STENT PLACEMENT      Social History:  reports that she has quit smoking. She has never used smokeless tobacco. She reports that she does not drink alcohol or use drugs.  Family History: Could not be reviewed accurately due to altered mental status.   Prior to Admission medications   Medication Sig Start Date End Date Taking? Authorizing Provider  amLODipine (NORVASC) 5 MG tablet Take 5 mg by mouth daily.   Yes [provider]  aspirin EC 81 MG tablet Take 81 mg by mouth every 3 (three) days.   Yes [provider]  atenolol (TENORMIN) 25 MG tablet Take by mouth 2 (two) times daily.   Yes [provider]  cephALEXin (KEFLEX) 500 MG capsule Take 1 capsule (500 mg total) by mouth 2 (two) times daily. 05/27/17 06/03/17 Yes Audry Pili, PA-C  Multiple Vitamin (MULTIVITAMIN WITH MINERALS) TABS tablet Take 1 tablet by mouth daily.   Yes [provider]  multivitamin-lutein (OCUVITE-LUTEIN) CAPS capsule Take 1 capsule by mouth daily.   Yes [provider]  oxybutynin (DITROPAN) 5 MG tablet Take 5 mg  by mouth 2 (two) times daily.   Yes [provider]  senna (SENOKOT) 8.6 MG TABS tablet Take 1 tablet by mouth 2 (two) times daily.   Yes [provider]    Physical Exam: Vitals:   05/29/17 0130 05/29/17 0210 05/29/17 0236 05/29/17 0531  BP: (!) 101/52 (!) 143/60 (!) 135/59 (!) 137/56  Pulse:  82 78 79  Resp:  18 20 16   Temp:   99.5 F (37.5 C) 99 F (37.2 C)  TempSrc:    Oral Oral  SpO2:  94% 94% 94%  Weight:   60.9 kg (134 lb 4.2 oz)   Height:   5\' 2"  (1.575 m)    General: Not in acute distress HEENT:       Eyes: PERRL, EOMI, no scleral icterus.       ENT: No discharge from the ears and nose, no pharynx injection, no tonsillar enlargement.        Neck: No JVD, no bruit, no mass felt. Heme: No neck lymph node enlargement. Cardiac: S1/S2, RRR, No murmurs, No gallops or rubs. Respiratory: No rales, wheezing, rhonchi or rubs. GI: Soft, nondistended, has tenderness in LLQ and suprapubic area, no rebound pain, no organomegaly, BS present. GU: No hematuria Ext: No pitting leg edema bilaterally. 2+DP/PT pulse bilaterally. Musculoskeletal: No joint deformities, No joint redness or warmth, no limitation of ROM in spin. Skin: No rashes.  Neuro: confused, oriented to place and person, not to time, cranial nerves II-XII grossly intact, moves all extremities normally.  Psych: Patient is not psychotic, no suicidal or hemocidal ideation.  Labs on Admission: I have personally reviewed following labs and imaging studies  CBC:  Recent Labs Lab 05/27/17 2134 05/28/17 2235  WBC 11.1* 14.3*  NEUTROABS 9.0* 11.9*  HGB 14.3 13.0  HCT 41.2 37.2  MCV 92.4 90.5  PLT 180 181   Basic Metabolic Panel:  Recent Labs Lab 05/27/17 2134 05/28/17 2236  NA 131* 131*  K 4.3 4.1  CL 98* 100*  CO2 24 24  GLUCOSE 141* 122*  BUN 18 13  CREATININE 0.77 0.73  CALCIUM 10.0 9.5   GFR: Estimated Creatinine Clearance: 36.9 mL/min (by C-G formula based on SCr of 0.73 mg/dL). Liver Function Tests:  Recent Labs Lab 05/27/17 2134 05/28/17 2236  AST 27 28  ALT 17 20  ALKPHOS 82 80  BILITOT 0.5 0.5  PROT 7.3 6.7  ALBUMIN 3.9 3.3*   No results for input(s): LIPASE, AMYLASE in the last 168 hours. No results for input(s): AMMONIA in the last 168 hours. Coagulation Profile: No results for input(s): INR, PROTIME in the last 168 hours. Cardiac Enzymes: No results for  input(s): CKTOTAL, CKMB, CKMBINDEX, TROPONINI in the last 168 hours. BNP (last 3 results) No results for input(s): PROBNP in the last 8760 hours. HbA1C: No results for input(s): HGBA1C in the last 72 hours. CBG: No results for input(s): GLUCAP in the last 168 hours. Lipid Profile: No results for input(s): CHOL, HDL, LDLCALC, TRIG, CHOLHDL, LDLDIRECT in the last 72 hours. Thyroid Function Tests: No results for input(s): TSH, T4TOTAL, FREET4, T3FREE, THYROIDAB in the last 72 hours. Anemia Panel: No results for input(s): VITAMINB12, FOLATE, FERRITIN, TIBC, IRON, RETICCTPCT in the last 72 hours. Urine analysis:    Component Value Date/Time   COLORURINE YELLOW 05/27/2017 2200   APPEARANCEUR TURBID (A) 05/27/2017 2200   LABSPEC 1.019 05/27/2017 2200   PHURINE 5.5 05/27/2017 2200   GLUCOSEU NEGATIVE 05/27/2017 2200   HGBUR MODERATE (A) 05/27/2017  2200   BILIRUBINUR NEGATIVE 05/27/2017 2200   KETONESUR 15 (A) 05/27/2017 2200   PROTEINUR 100 (A) 05/27/2017 2200   NITRITE POSITIVE (A) 05/27/2017 2200   LEUKOCYTESUR LARGE (A) 05/27/2017 2200   Sepsis Labs: @LABRCNTIP (procalcitonin:4,lacticidven:4) )No results found for this or any previous visit (from the past 240 hour(s)).   Radiological Exams on Admission: Dg Chest 2 View  Result Date: 05/29/2017 CLINICAL DATA:  83 year old female with productive cough for about a week and fever. EXAM: CHEST  2 VIEW COMPARISON:  Chest radiograph dated 05/27/2017 FINDINGS: There is shallow inspiration. There are hazy bibasilar densities, right greater than left which appear more prominent and more confluent compared to the prior radiograph. This may represent atelectatic changes/scarring appearing more prominent secondary to decreased inspiratory effort compared to prior radiograph. However, developing infiltrate is not excluded. Correlation with clinical exam is recommended. There is no pleural effusion or pneumothorax. Probable mild bronchiectatic changes.  The cardiac silhouette is within normal limits. Coronary vascular stent noted. There is atherosclerotic calcification of the thoracic aorta. There is osteopenia with degenerative changes of the spine. No acute osseous pathology. IMPRESSION: Hazy bibasilar densities, right greater than left may represent atelectatic changes, although developing infiltrate is not excluded. Correlation with clinical exam recommended. Electronically Signed   By: Elgie Collard M.D.   On: 05/29/2017 00:34   Dg Chest 2 View  Result Date: 05/27/2017 CLINICAL DATA:  Cough. EXAM: CHEST  2 VIEW COMPARISON:  None. FINDINGS: The heart size and mediastinal contours are within normal limits. Atherosclerosis of thoracic aorta is noted. No pneumothorax or pleural effusion is noted. Mild bibasilar atelectasis or scarring is noted. The visualized skeletal structures are unremarkable. IMPRESSION: Aortic atherosclerosis. Mild bibasilar subsegmental atelectasis or scarring. Electronically Signed   By: Lupita Raider, M.D.   On: 05/27/2017 21:49     EKG: Independently reviewed.  Sinus rhythm, QTC 45, mild T-wave inversion in precordial leads.  Assessment/Plan Principal Problem:   UTI (urinary tract infection) Active Problems:   Hypertension   CAP (community acquired pneumonia)   Acute metabolic encephalopathy   Sepsis (HCC)   CAD (coronary artery disease)   Abdominal pain   Sepsis due to CAP and UTI (urinary tract infection): Urinalysis showed large amount of leukocytes and positive nitrate, consistent with  UTI. Patient has productive cough, fever and leukocytosis, plus chest x-ray findings of bilateral basilar infiltration, consistent with CAP.  Pt meets criteria for sepsis with leukocytosis, fever. Lactic acid is normal. Hemodynamically stable currently.  - will admit to tele be as inpt - IV Rocephin and doxycycline - Mucinex for cough  - prn Albuterol Nebs for SOB - Urine legionella and S. pneumococcal antigen - Follow  up blood culture x2, sputum culture and plus Flu pcr - f/u urine culture - IVF: 1L of NS bolus in ED, followed by per hour of NS   Acute metabolic encephalopathy: due to ongoing infection and sepsis -Treat UTI and CAP as above -Frequent neuro check  CAD:  S/p of stent. No CP -continue aspirin and atenolol  Hypertension: -Continue atenolol, amlodipine,  Abdominal pain: pt has tenderness in the suprapubic area which is likely due to UTI. She also has tenderness in the left lower quadrant, which cannot be explained by a UTI. Etiology is not clear. -Check lipase -CT-abdomen/pelvis  DVT ppx: SQ Lovenox  Code Status: DNR (I discussed with patient's grandson, who is the power of attorney, patient would want to be DNR) Family Communication: Yes, patient's grandson at  bed side Disposition Plan:  Anticipate discharge back to previous home environment Consults called:  none Admission status: Inpatient/tele       Date of Service 05/29/2017    Lorretta Harp [facility] Pager [phone]  If 7PM-7AM, please contact night-coverage [email or web address] Password [email or web address] 05/29/2017, 5:48 AM

## 2017-05-29 NOTE — Progress Notes (Signed)
Patient seen and examined at bedside, patient admitted after midnight, please see earlier detailed admission note by Lorretta HarpXilin Niu, MD. Briefly, patient presented concern for community acquired pneumonia and UTI, meeting sepsis physiology on admission, and associated metabolic encephalopathy. Symptoms appear to be improving with treatment. No changes to her current regimen. CT abdomen pelvis is pending for evaluation of abdominal pain.   Jacquelin Hawkingalph Ronney Honeywell, MD [facility] 05/29/2017, 11:20 AM Pager: [phone]

## 2017-05-30 ENCOUNTER — Inpatient Hospital Stay (HOSPITAL_COMMUNITY): Payer: Medicare Other

## 2017-05-30 DIAGNOSIS — J690 Pneumonitis due to inhalation of food and vomit: Secondary | ICD-10-CM

## 2017-05-30 LAB — CBC
HCT: 32.9 % — ABNORMAL LOW (ref 36.0–46.0)
HEMOGLOBIN: 11.5 g/dL — AB (ref 12.0–15.0)
MCH: 31.4 pg (ref 26.0–34.0)
MCHC: 35 g/dL (ref 30.0–36.0)
MCV: 89.9 fL (ref 78.0–100.0)
Platelets: 208 10*3/uL (ref 150–400)
RBC: 3.66 MIL/uL — ABNORMAL LOW (ref 3.87–5.11)
RDW: 13.4 % (ref 11.5–15.5)
WBC: 14.1 10*3/uL — AB (ref 4.0–10.5)

## 2017-05-30 LAB — URINE CULTURE: Culture: >=100000 — AB

## 2017-05-30 LAB — BASIC METABOLIC PANEL
ANION GAP: 9 (ref 5–15)
BUN: 10 mg/dL (ref 6–20)
CALCIUM: 8.4 mg/dL — AB (ref 8.9–10.3)
CO2: 19 mmol/L — ABNORMAL LOW (ref 22–32)
Chloride: 102 mmol/L (ref 101–111)
Creatinine, Ser: 0.7 mg/dL (ref 0.44–1.00)
GFR calc Af Amer: >60 mL/min (ref >60)
GLUCOSE: 162 mg/dL — AB (ref 65–99)
Potassium: 3.6 mmol/L (ref 3.5–5.1)
Sodium: 130 mmol/L — ABNORMAL LOW (ref 135–145)

## 2017-05-30 LAB — LEGIONELLA PNEUMOPHILA SEROGP 1 UR AG: L. PNEUMOPHILA SEROGP 1 UR AG: NEGATIVE

## 2017-05-30 MED ORDER — BUDESONIDE 0.5 MG/2ML IN SUSP
0.5000 mg | Freq: Two times a day (BID) | RESPIRATORY_TRACT | Status: DC
  Administered 2017-05-30 – 2017-06-03 (×8): 0.5 mg via RESPIRATORY_TRACT
  Filled 2017-05-30 (×9): qty 2

## 2017-05-30 MED ORDER — IPRATROPIUM-ALBUTEROL 0.5-2.5 (3) MG/3ML IN SOLN
3.0000 mL | Freq: Four times a day (QID) | RESPIRATORY_TRACT | Status: DC
  Administered 2017-05-30 – 2017-06-03 (×14): 3 mL via RESPIRATORY_TRACT
  Filled 2017-05-30 (×17): qty 3

## 2017-05-30 MED ORDER — ENSURE ENLIVE PO LIQD
237.0000 mL | Freq: Two times a day (BID) | ORAL | Status: DC
  Administered 2017-05-30 – 2017-06-03 (×10): 237 mL via ORAL

## 2017-05-30 MED ORDER — DOXYCYCLINE HYCLATE 100 MG IV SOLR
100.0000 mg | Freq: Two times a day (BID) | INTRAVENOUS | Status: DC
  Filled 2017-05-30: qty 100

## 2017-05-30 NOTE — Evaluation (Signed)
Clinical/Bedside Swallow Evaluation Patient Details  Name: Sara Clark MRN: [id number] Date of Birth: Jul 30, 1920  Today's Date: 05/30/2017 Time: SLP Start Time (ACUTE ONLY): 1125 SLP Stop Time (ACUTE ONLY): 1156 SLP Time Calculation (min) (ACUTE ONLY): 31 min  Past Medical History:  Past Medical History:  Diagnosis Date  . Hypertension   . Macular degeneration   . Myocardial infarct (HCC)   . Urinary incontinence   . Venous stasis    Past Surgical History:  Past Surgical History:  Procedure Laterality Date  . CORONARY ANGIOPLASTY WITH STENT PLACEMENT     HPI:  Ptis a 83 y.o.femalewith medical history significant for hypertension, CAD, stent placement, macular degeneration, and urinary incontinence, who presents with altered mental status, cough and abdominal pain. She was found to have a UTI. CT also showed right lower lobe airspace disease most consistent with either infection or aspiration. Small hiatal hernia with distal esophageal contrast level suggests dysmotility or gastroesophageal reflux and could predispose the patient to aspiration.   Assessment / Plan / Recommendation Clinical Impression  Pt presents with signs of a primary esophageal dysphagia, although secondary pharyngeal involvement cannot be excluded. At baseline she has an intermittent, congested cough and frequent eructation. She describes having heart burn "all the time." When given trials of various textures she does not have any immediate coughing following her swallows, but occasional congested coughing is still noted. Her vocal quality remains clear. Particularly as she is swallowing solids, she starts rubbing her sternum and complaining of the food "not going down." For now, recommend Dys 2 diet and thin liquids to facilitate esophageal clearance. Particularly given results of her CT with concern for esophageal issues and aspiration, may wish to consider barium swallow if pt/family would like to investigate  swallowing function further. SLP will continue to follow for tolerance. SLP Visit Diagnosis: Dysphagia, unspecified (R13.10)    Aspiration Risk  Mild aspiration risk;Moderate aspiration risk (particularly post-prandial aspiration)    Diet Recommendation Dysphagia 2 (Fine chop);Thin liquid   Liquid Administration via: Cup;Straw Medication Administration: Whole meds with puree Supervision: Patient able to self feed;Intermittent supervision to cue for compensatory strategies Compensations: Slow rate;Small sips/bites;Follow solids with liquid Postural Changes: Seated upright at 90 degrees;Remain upright for at least 30 minutes after po intake    Other  Recommendations Recommended Consults: Consider esophageal assessment Oral Care Recommendations: Oral care BID   Follow up Recommendations  (tba)      Frequency and Duration min 2x/week  2 weeks       Prognosis Prognosis for Safe Diet Advancement: Fair Barriers to Reach Goals: Other (Comment) (question esophageal etiology)      Swallow Study   General HPI: Ptis a 83 y.o.femalewith medical history significant for hypertension, CAD, stent placement, macular degeneration, and urinary incontinence, who presents with altered mental status, cough and abdominal pain. She was found to have a UTI. CT also showed right lower lobe airspace disease most consistent with either infection or aspiration. Small hiatal hernia with distal esophageal contrast level suggests dysmotility or gastroesophageal reflux and could predispose the patient to aspiration. Type of Study: Bedside Swallow Evaluation Previous Swallow Assessment: none in chart Diet Prior to this Study: Regular;Thin liquids Temperature Spikes Noted: No Respiratory Status: Room air History of Recent Intubation: No Behavior/Cognition: Alert;Cooperative;Pleasant mood Oral Care Completed by SLP: No Oral Cavity - Dentition: Adequate natural dentition Vision: Functional for  self-feeding Self-Feeding Abilities: Able to feed self Patient Positioning: Upright in bed Baseline Vocal Quality: Normal Volitional Cough: Congested  Oral/Motor/Sensory Function     Ice [employer] chips: Not tested   Thin Liquid Thin Liquid: Impaired Presentation: Self Fed;Straw Pharyngeal  Phase Impairments: Cough - Delayed    Nectar Thick Nectar Thick Liquid: Not tested   Honey Thick Honey Thick Liquid: Not tested   Puree Puree: Impaired Presentation: Self Fed;Spoon Pharyngeal Phase Impairments: Cough - Delayed   Solid   GO   Solid: Impaired Presentation: Self Fed Pharyngeal Phase Impairments: Cough - Delayed        Maxcine Ham 05/30/2017,12:25 PM  Maxcine Ham, M.A. CCC-SLP [phone]

## 2017-05-30 NOTE — Evaluation (Signed)
Physical Therapy Evaluation Patient Details Name: Sara OaksLillie Trentman MRN: [id number] DOB: 1920/09/25 Today's Date: 05/30/2017   History of Present Illness  83 y/o F who [facility] with confusion. Found to have fever & urinalysis consistent with UTI. She returned to the ER on 5/29 via EMS with reports of lower abdominal pain, weakness, productive cough and increased confusion  Clinical Impression  The patient reports feeling poorly. She ambulated  In the room with  Assistance and RW.. Currently is not at her baseline. May benefit from SNF for rehab prior to return to apartment. Pt admitted with above diagnosis. Pt currently with functional limitations due to the deficits listed below (see PT Problem List). Pt will benefit from skilled PT to increase their independence and safety with mobility to allow discharge to the venue listed below.        Follow Up Recommendations SNF;Supervision/Assistance - 24 hour    Equipment Recommendations  None recommended by PT    Recommendations for Other Services       Precautions / Restrictions Precautions Precautions: Fall      Mobility  Bed Mobility Overal bed mobility: Needs Assistance Bed Mobility: Rolling;Sidelying to Sit;Sit to Supine Rolling: Supervision Sidelying to sit: HOB elevated;Min assist   Sit to supine: Min guard   General bed mobility comments: assist with trunk to sit up. able to get back into bed without assistance  Transfers Overall transfer level: Needs assistance Equipment used: 4-wheeled walker Transfers: Sit to/from Stand Sit to Stand: Mod assist         General transfer comment: steady assist to rise from the bed  Ambulation/Gait Ambulation/Gait assistance: Mod assist Ambulation Distance (Feet): 10 Feet Assistive device: 4-wheeled walker Gait Pattern/deviations: Step-to pattern     General Gait Details: gait unsteady, assist to guide around furniture.  Stairs             Wheelchair Mobility    Modified Rankin (Stroke Patients Only)       Balance Overall balance assessment: Needs assistance Sitting-balance support: No upper extremity supported;Feet supported Sitting balance-Leahy Scale: Fair     Standing balance support: Bilateral upper extremity supported;During functional activity Standing balance-Leahy Scale: Poor                               Pertinent Vitals/Pain Pain Assessment: Faces Faces Pain Scale: Hurts little more Pain Location: generalized all over Pain Descriptors / Indicators: Aching Pain Intervention(s): Monitored during session    Home Living Family/patient expects to be discharged to:: Private residence Living Arrangements: Alone   Type of Home: Apartment       Home Layout: One level Home Equipment: Environmental consultantWalker - 4 wheels      Prior Function Level of Independence: Independent with assistive device(s)         Comments: walks to meals     Hand Dominance        Extremity/Trunk Assessment   Upper Extremity Assessment Upper Extremity Assessment: Generalized weakness    Lower Extremity Assessment Lower Extremity Assessment: Generalized weakness    Cervical / Trunk Assessment Cervical / Trunk Assessment: Kyphotic  Communication   Communication: No difficulties  Cognition Arousal/Alertness: Awake/alert Behavior During Therapy: WFL for tasks assessed/performed Overall Cognitive Status: Within Functional Limits for tasks assessed  General Comments      Exercises     Assessment/Plan    PT Assessment Patient needs continued PT services  PT Problem List Decreased strength;Decreased activity tolerance;Decreased balance;Decreased mobility;Cardiopulmonary status limiting activity;Decreased safety awareness;Decreased knowledge of use of DME;Pain       PT Treatment Interventions DME instruction;Gait training;Functional mobility  training;Therapeutic activities;Patient/family education    PT Goals (Current goals can be found in the Care Plan section)  Acute Rehab PT Goals Patient Stated Goal: to feel better, walk  PT Goal Formulation: With patient Time For Goal Achievement: 06/13/17 Potential to Achieve Goals: Fair    Frequency Min 3X/week   Barriers to discharge Decreased caregiver support      Co-evaluation               AM-PAC PT "6 Clicks" Daily Activity  Outcome Measure Difficulty turning over in bed (including adjusting bedclothes, sheets and blankets)?: A Little Difficulty moving from lying on back to sitting on the side of the bed? : A Little Difficulty sitting down on and standing up from a chair with arms (e.g., wheelchair, bedside commode, etc,.)?: A Lot Help needed moving to and from a bed to chair (including a wheelchair)?: A Lot Help needed walking in hospital room?: A Lot Help needed climbing 3-5 steps with a railing? : Total 6 Click Score: 13    End of Session Equipment Utilized During Treatment: Gait belt Activity Tolerance: Patient limited by fatigue Patient left: in bed;with call bell/phone within reach;with bed alarm set Nurse Communication: Mobility status PT Visit Diagnosis: Unsteadiness on feet (R26.81);Difficulty in walking, not elsewhere classified (R26.2)    Time: 1610-9604 PT Time Calculation (min) (ACUTE ONLY): 28 min   Charges:   PT Evaluation $PT Eval Low Complexity: 1 Procedure PT Treatments $Gait Training: 8-22 mins   PT G CodesBlanchard Kelch PT [phone]   Rada Hay 05/30/2017, 2:25 PM

## 2017-05-30 NOTE — Consult Note (Addendum)
Name: Sara Clark MRN: [id number] DOB: August 29, 1920    ADMISSION DATE:  05/28/2017 CONSULTATION DATE:  05/30/17  REFERRING MD :  Dr. Caleb Popp / [facility]   CHIEF COMPLAINT: Confusion, PNA.   HISTORY OF PRESENT ILLNESS:  83 y/o F who presented to [facility] on 5/28 with confusion.  Her family reported that she had recently had a URI and had managed with Mucinex.  The entire family was sick from the viral illness.  She was worked up and found to have fever & urinalysis consistent with UTI.  She was given antibiotics (Keflex) for UTI and discharged with family.    At baseline, she gets around with a walker.  She recently moved from [place] to [place] to an apartment.  She no longer drives.  She is a never smoker but was exposed to second hand smoke from childhood into her marriage until her husband passed.       She returned to the ER on 5/29 via EMS with reports of lower abdominal pain, weakness, productive cough and increased confusion. Prior urine culture from 5/28 grew > 100k E-Coli (S- rocephin, R- Cipro).  CXR was evaluated on admit and was concerning for RLL infiltrate.  Initial WBC 11.1 > 14.1 on 5/31, negative lactate, negative PCT.  CT of the abdomen was completed with reports of abdominal pain which showed cholelithiasis without evidence of acute cholecystitis, RLL airspace disease, small hiatal hernia with distal esophageal contrast level suggestive of dysmotility or GERD, trace right pleural fluid, CAD, 4mm lingular nodule.  The patient was admitted by [facility] for further evaluation.  She was treated with IV doxycycline, rocephin and flagyl.  The patient remains on room air with saturations documented from 92-97%.  She was evaluated 5/31 by SLP for dysphagia and found to need a dysphagia 2 (fine chop) diet with thin liquids.    PCCM consulted for evaluation of PNA.    PAST MEDICAL HISTORY :   has a past medical history of Hypertension; Macular degeneration; Myocardial infarct (HCC);  Urinary incontinence; and Venous stasis.   has a past surgical history that includes Coronary angioplasty with stent.  Prior to Admission medications   Medication Sig Start Date End Date Taking? Authorizing Provider  amLODipine (NORVASC) 5 MG tablet Take 5 mg by mouth daily.   Yes [provider]  aspirin EC 81 MG tablet Take 81 mg by mouth every 3 (three) days.   Yes [provider]  atenolol (TENORMIN) 25 MG tablet Take by mouth 2 (two) times daily.   Yes [provider]  cephALEXin (KEFLEX) 500 MG capsule Take 1 capsule (500 mg total) by mouth 2 (two) times daily. 05/27/17 06/03/17 Yes Audry Pili, PA-C  Multiple Vitamin (MULTIVITAMIN WITH MINERALS) TABS tablet Take 1 tablet by mouth daily.   Yes [provider]  multivitamin-lutein (OCUVITE-LUTEIN) CAPS capsule Take 1 capsule by mouth daily.   Yes [provider]  oxybutynin (DITROPAN) 5 MG tablet Take 5 mg by mouth 2 (two) times daily.   Yes [provider]  senna (SENOKOT) 8.6 MG TABS tablet Take 1 tablet by mouth 2 (two) times daily.   Yes [provider]    Allergies  Allergen Reactions  . Erythromycin   . Penicillins     FAMILY HISTORY:  family history is not on file.  SOCIAL HISTORY:  reports that she has quit smoking. She has never used smokeless tobacco. She reports that she does not drink alcohol or use drugs.  REVIEW OF SYSTEMS:  POSITIVES IN BOLD Constitutional: Negative for fever, chills, weight loss, malaise/fatigue and diaphoresis.  HENT: Negative for hearing loss, ear pain, nosebleeds, congestion, sore throat, neck pain, tinnitus and ear discharge.   Eyes: Negative for blurred vision, double vision, photophobia, pain, discharge and redness.  Respiratory: Negative for cough, hemoptysis, sputum production, shortness of breath, wheezing and stridor.   Cardiovascular: Negative for chest pain, palpitations, orthopnea, claudication, leg swelling and PND.    Gastrointestinal: Negative for heartburn, nausea, vomiting, abdominal pain, diarrhea, constipation, blood in stool and melena.  Genitourinary: Negative for dysuria, urgency, frequency, hematuria and flank pain.  Musculoskeletal: Negative for myalgias, back pain, joint pain and falls.  Skin: Negative for itching and rash.  Neurological: Negative for dizziness, tingling, tremors, sensory change, speech change, focal weakness, seizures, loss of consciousness, weakness and headaches.  Endo/Heme/Allergies: Negative for environmental allergies and polydipsia. Does not bruise/bleed easily.  SUBJECTIVE: Pt reports feeling better but states she is still tired and coughing up secretions  VITAL SIGNS: Temp:  [98.4 F (36.9 C)-98.9 F (37.2 C)] 98.4 F (36.9 C) (05/31 0830) Pulse Rate:  [80-92] 92 (05/31 0830) Resp:  [18] 18 (05/31 0830) BP: (124-145)/(47-58) 131/47 (05/31 0830) SpO2:  [92 %-97 %] 92 % (05/31 0830)  PHYSICAL EXAMINATION: General: frail elderly female in NAD, sleeping upon entering the room  HEENT: MM pink/moist, no jvd PSY: calm/appropriate Neuro: AAOx4, speech clear, MAE  CV: s1s2 rrr, no m/r/g PULM: even/non-labored, lungs bilaterally with coarse rhonchi  ZO:XWRU, non-tender, bsx4 active  Extremities: warm/dry, no edema  Skin: no rashes or lesions    Recent Labs Lab 05/27/17 2134 05/28/17 2236 05/30/17 1032  NA 131* 131* 130*  K 4.3 4.1 3.6  CL 98* 100* 102  CO2 24 24 19*  BUN 18 13 10   CREATININE 0.77 0.73 0.70  GLUCOSE 141* 122* 162*     Recent Labs Lab 05/27/17 2134 05/28/17 2235 05/30/17 1032  HGB 14.3 13.0 11.5*  HCT 41.2 37.2 32.9*  WBC 11.1* 14.3* 14.1*  PLT 180 181 208    Dg Chest 2 View  Result Date: 05/29/2017 CLINICAL DATA:  83 year old female with productive cough for about a week and fever. EXAM: CHEST  2 VIEW COMPARISON:  Chest radiograph dated 05/27/2017 FINDINGS: There is shallow inspiration. There are hazy bibasilar densities,  right greater than left which appear more prominent and more confluent compared to the prior radiograph. This may represent atelectatic changes/scarring appearing more prominent secondary to decreased inspiratory effort compared to prior radiograph. However, developing infiltrate is not excluded. Correlation with clinical exam is recommended. There is no pleural effusion or pneumothorax. Probable mild bronchiectatic changes. The cardiac silhouette is within normal limits. Coronary vascular stent noted. There is atherosclerotic calcification of the thoracic aorta. There is osteopenia with degenerative changes of the spine. No acute osseous pathology. IMPRESSION: Hazy bibasilar densities, right greater than left may represent atelectatic changes, although developing infiltrate is not excluded. Correlation with clinical exam recommended. Electronically Signed   By: Elgie Collard M.D.   On: 05/29/2017 00:34   Ct Abdomen Pelvis W Contrast  Result Date: 05/29/2017 CLINICAL DATA:  Abdominal pain. Admitted for urinary tract infection and probable pneumonia. Sepsis clinically. Metabolic encephalopathy. EXAM: CT ABDOMEN AND PELVIS WITH CONTRAST TECHNIQUE: Multidetector CT imaging of the abdomen and pelvis was performed using the standard protocol following bolus administration of intravenous contrast. CONTRAST:  80mL ISOVUE-300 IOPAMIDOL (ISOVUE-300) INJECTION 61% COMPARISON:  Chest radiographs including earlier today. FINDINGS: Lower chest: Motion  degradation at the lung bases. Patchy right lower lobe airspace disease. Minimal left lower lobe dependent atelectasis. Probable scarring in the inferior right middle lobe and posterior lingula. The posterior lingular nodule measures 4 mm on image 19/series 6. Normal heart size with multivessel coronary artery atherosclerosis. Small hiatal hernia with subtle contrast level in the distal esophagus on image 11/series 2. Trace right pleural fluid. Hepatobiliary: Calcification  in the liver is likely related to old granulomatous disease. The gallbladder is stone filled, without surrounding inflammation or biliary duct dilatation. Pancreas: Mild fatty replacement involving the pancreatic body, head, and uncinate process. No duct dilatation or dominant mass. Spleen: Old granulomatous disease in the spleen. Adrenals/Urinary Tract: Normal adrenal glands. Partially malrotated right kidney. Too small to characterize lesions in both kidneys. No hydronephrosis. Normal urinary bladder. Stomach/Bowel: Normal remainder of the stomach. Mild mid abdominal motion degradation. Extensive colonic diverticulosis. Wall thickening involving the sigmoid is likely related to muscular hypertrophy. No convincing evidence of surrounding inflammation Normal terminal ileum. Appendix is not visualized but there is no evidence of right lower quadrant inflammation. Normal small bowel. Vascular/Lymphatic: Advanced aortic and branch vessel atherosclerosis. No abdominopelvic adenopathy. Reproductive: Hysterectomy.  No adnexal mass. Other: No significant free fluid. Mild pelvic floor laxity. Tiny fat containing right inguinal hernia. No free intraperitoneal air. Gas in the anterior abdominal wall is eccentric right and likely iatrogenic on image 33/series 2. Musculoskeletal: Mild osteopenia. Lumbosacral spondylosis with likely secondary trace L4-5 anterolisthesis. Multilevel disc bulges. IMPRESSION: 1. Mildly motion degraded exam. 2. Cholelithiasis. No evidence of acute cholecystitis and no other explanation for abdominal pain. 3. Right lower lobe airspace disease is most consistent with either infection or aspiration. Small hiatal hernia with distal esophageal contrast level suggests dysmotility or gastroesophageal reflux and could predispose the patient to aspiration. 4. Trace right pleural fluid. 5.  Coronary artery atherosclerosis. Aortic atherosclerosis. 6. 4 mm lingular nodule. No follow-up needed if patient is  low-risk. Non-contrast chest CT can be considered in 12 months if patient is high-risk. This recommendation follows the consensus statement: Guidelines for Management of Incidental Pulmonary Nodules Detected on CT Images: From the Fleischner Society 2017; Radiology 2017; 284:228-243. Electronically Signed   By: Jeronimo Greaves M.D.   On: 05/29/2017 12:22      SIGNIFICANT EVENTS  5/28  ER visit, treated for UTI & discharged home  5/29  Return to ER via EMS with confusion, AMS.  Concern for PNA, admitted per [facility]  5/31  PCCM consulted for evaluation of PNA  STUDIES:  CT ABD/Pelvis 5/30 >> cholelithiasis without evidence of acute cholecystitis, RLL airspace disease, small hiatal hernia with distal esophageal contrast level suggestive of dysmotility or GERD, trace right pleural fluid, CAD, 4mm lingular nodule  ANTIBIOTICS:  Flagyl 5/30 >> 5/31 Doxycycline 5/30 >> 5/31 Rocephin 5/30 >>   CULTURES: UC 5/28 >> E-Coli >> S- rocephin, R- Cipro Urine Legionella 5/30 >>  Urine Strep Antigen 5/30 >> negative  BCx2 5/29 >>  Sputum 5/29 >>   ASSESSMENT / PLAN:  Discussion:  83 y/o F admitted with E-Coli UTI and possible aspiration PNA.  She had a recent URI then developed a UTI and subsequent weakness.       RLL Airspace Disease / Aspiration PNA - basilar scarring noted on CT chest & possible aspiration PNA.  Suspect weakness in setting of UTI amplified possible underlying dysphagia   Plan:  Narrow abx to rocephin  Follow up CXR today & in am  Push pulmonary hygiene - IS,  flutter valve, mobilize as able  PRN albuterol  Continue Mucinex Vibra vest Q4 while awake PT consult for early mobilization    GERD Dysphagia   Plan: Continue modified diet per SLP  Aspiration precautions  Assess MBS with SLP    E-Coli UTI   Plan: Continue rocephin > see sensitivities from 5/28 urine culture   Acute Encephalopathy - in setting of UTI, advanced age   Plan: Supportive care, she is high risk  for delirium Correct underlying causes  Promote sleep / wake cycle    Family - grandson Jesusita Oka) called and updated on patients status / plan of care.   Thank you for the consultation.  PCCM will follow along with you.    Canary Brim, NP-C [facility] Pgr: [phone] or if no answer [phone] 05/30/2017, 12:21 PM   ATTENDING NOTE / ATTESTATION NOTE :   I have discussed the case with the resident/APP  Canary Brim NP  I agree with the resident/APP's  history, physical examination, assessment, and plans.    I have edited the above note and modified it according to our agreed history, physical examination, assessment and plan.   Briefly, nonsmoker, denies any history of lung disease, comes in with two-week history of shortness of breath. Also with cough and difficult expectoration. She was at the emergency room recently and was diagnosed with UTI. Initial chest x-ray showed possible bibasilar atelectasis. She had subsequent imaging which shows increased infiltrate/atelectasis over the right base. She had a CAT scan of her abdomen which showed R lower lobe infiltrate/atelectasis. She has issues expectorating. Remains winded. She is being treated for pneumonia and UTI. Sometimes, she has issues with swallowing.  Vitals:  Vitals:   05/29/17 2031 05/30/17 0415 05/30/17 0830 05/30/17 1355  BP: (!) 145/55 (!) 131/58 (!) 131/47 101/74  Pulse: 92 82 92 (!) 54  Resp: 18 18 18    Temp: 98.9 F (37.2 C) 98.8 F (37.1 C) 98.4 F (36.9 C) 99.8 F (37.7 C)  TempSrc: Oral Oral Oral Oral  SpO2: 95% 93% 92% 94%  Weight:      Height:        Constitutional/General: well-nourished, well-developed, not in any distress. Coughing.   Body mass index is 24.56 kg/m. Wt Readings from Last 3 Encounters:  05/29/17 134 lb 4.2 oz (60.9 kg)  05/27/17 120 lb (54.4 kg)    HEENT: PERLA, anicteric sclerae. (-) Oral thrush.  Neck: No masses. Midline trachea. No JVD, (-) LAD. (-) bruits  appreciated.  Respiratory/Chest: Grossly normal chest. (-) deformity. (-) Accessory muscle use.  Symmetric expansion. Diminished BS on both lower lung zones. (-) wheezing,  Rhonchi Crackles at the bases.  (-) egophony  Cardiovascular: Regular rate and  rhythm, heart sounds normal, no murmur or gallops,  Trace peripheral trace edema  Gastrointestinal:  Normal bowel sounds. Soft, non-tender. No hepatosplenomegaly.  (-) masses.   Musculoskeletal:  Normal muscle tone.   Extremities: Grossly normal. (-) clubbing, cyanosis.  Trace  edema  Skin: (-) rash,lesions seen.   Neurological/Psychiatric :  CN grossly intact. (-) lateralizing signs.     CBC Recent Labs     05/27/17  2134  05/28/17  2235  05/30/17  1032  WBC  11.1*  14.3*  14.1*  HGB  14.3  13.0  11.5*  HCT  41.2  37.2  32.9*  PLT  180  181  208    Coag's No results for input(s): APTT, INR in the last 72 hours.  BMET Recent Labs     05/27/17  2134  05/28/17  2236  05/30/17  1032  NA  131*  131*  130*  K  4.3  4.1  3.6  CL  98*  100*  102  CO2  24  24  19*  BUN  18  13  10   CREATININE  0.77  0.73  0.70  GLUCOSE  141*  122*  162*    Electrolytes Recent Labs     05/27/17  2134  05/28/17  2236  05/30/17  1032  CALCIUM  10.0  9.5  8.4*    Sepsis Markers Recent Labs     05/29/17  0204  PROCALCITON  <0.10    ABG No results for input(s): PHART, PCO2ART, PO2ART in the last 72 hours.  Liver Enzymes Recent Labs     05/27/17  2134  05/28/17  2236  AST  27  28  ALT  17  20  ALKPHOS  82  80  BILITOT  0.5  0.5  ALBUMIN  3.9  3.3*    Cardiac Enzymes No results for input(s): TROPONINI, PROBNP in the last 72 hours.  Glucose No results for input(s): GLUCAP in the last 72 hours.  Imaging Dg Chest 2 View  Result Date: 05/29/2017 CLINICAL DATA:  83 year old female with productive cough for about a week and fever. EXAM: CHEST  2 VIEW COMPARISON:  Chest radiograph dated 05/27/2017 FINDINGS: There  is shallow inspiration. There are hazy bibasilar densities, right greater than left which appear more prominent and more confluent compared to the prior radiograph. This may represent atelectatic changes/scarring appearing more prominent secondary to decreased inspiratory effort compared to prior radiograph. However, developing infiltrate is not excluded. Correlation with clinical exam is recommended. There is no pleural effusion or pneumothorax. Probable mild bronchiectatic changes. The cardiac silhouette is within normal limits. Coronary vascular stent noted. There is atherosclerotic calcification of the thoracic aorta. There is osteopenia with degenerative changes of the spine. No acute osseous pathology. IMPRESSION: Hazy bibasilar densities, right greater than left may represent atelectatic changes, although developing infiltrate is not excluded. Correlation with clinical exam recommended. Electronically Signed   By: Elgie CollardArash  Radparvar M.D.   On: 05/29/2017 00:34   Ct Abdomen Pelvis W Contrast  Result Date: 05/29/2017 CLINICAL DATA:  Abdominal pain. Admitted for urinary tract infection and probable pneumonia. Sepsis clinically. Metabolic encephalopathy. EXAM: CT ABDOMEN AND PELVIS WITH CONTRAST TECHNIQUE: Multidetector CT imaging of the abdomen and pelvis was performed using the standard protocol following bolus administration of intravenous contrast. CONTRAST:  80mL ISOVUE-300 IOPAMIDOL (ISOVUE-300) INJECTION 61% COMPARISON:  Chest radiographs including earlier today. FINDINGS: Lower chest: Motion degradation at the lung bases. Patchy right lower lobe airspace disease. Minimal left lower lobe dependent atelectasis. Probable scarring in the inferior right middle lobe and posterior lingula. The posterior lingular nodule measures 4 mm on image 19/series 6. Normal heart size with multivessel coronary artery atherosclerosis. Small hiatal hernia with subtle contrast level in the distal esophagus on image 11/series  2. Trace right pleural fluid. Hepatobiliary: Calcification in the liver is likely related to old granulomatous disease. The gallbladder is stone filled, without surrounding inflammation or biliary duct dilatation. Pancreas: Mild fatty replacement involving the pancreatic body, head, and uncinate process. No duct dilatation or dominant mass. Spleen: Old granulomatous disease in the spleen. Adrenals/Urinary Tract: Normal adrenal glands. Partially malrotated right kidney. Too small to characterize lesions in both kidneys. No hydronephrosis. Normal urinary bladder. Stomach/Bowel: Normal remainder of the  stomach. Mild mid abdominal motion degradation. Extensive colonic diverticulosis. Wall thickening involving the sigmoid is likely related to muscular hypertrophy. No convincing evidence of surrounding inflammation Normal terminal ileum. Appendix is not visualized but there is no evidence of right lower quadrant inflammation. Normal small bowel. Vascular/Lymphatic: Advanced aortic and branch vessel atherosclerosis. No abdominopelvic adenopathy. Reproductive: Hysterectomy.  No adnexal mass. Other: No significant free fluid. Mild pelvic floor laxity. Tiny fat containing right inguinal hernia. No free intraperitoneal air. Gas in the anterior abdominal wall is eccentric right and likely iatrogenic on image 33/series 2. Musculoskeletal: Mild osteopenia. Lumbosacral spondylosis with likely secondary trace L4-5 anterolisthesis. Multilevel disc bulges. IMPRESSION: 1. Mildly motion degraded exam. 2. Cholelithiasis. No evidence of acute cholecystitis and no other explanation for abdominal pain. 3. Right lower lobe airspace disease is most consistent with either infection or aspiration. Small hiatal hernia with distal esophageal contrast level suggests dysmotility or gastroesophageal reflux and could predispose the patient to aspiration. 4. Trace right pleural fluid. 5.  Coronary artery atherosclerosis. Aortic atherosclerosis. 6. 4  mm lingular nodule. No follow-up needed if patient is low-risk. Non-contrast chest CT can be considered in 12 months if patient is high-risk. This recommendation follows the consensus statement: Guidelines for Management of Incidental Pulmonary Nodules Detected on CT Images: From the Fleischner Society 2017; Radiology 2017; 284:228-243. Electronically Signed   By: Jeronimo Greaves M.D.   On: 05/29/2017 12:22   Dg Chest Port 1 View  Result Date: 05/30/2017 CLINICAL DATA:  Aspiration pneumonia. EXAM: PORTABLE CHEST 1 VIEW COMPARISON:  05/29/2017. FINDINGS: Heart size normal. Coronary stents noted. Right lower lobe infiltrate noted consistent with pneumonia. Findings progressed from prior exam. Associated small right pleural effusion. Mild left base infiltrate with small left pleural effusion cannot be completely excluded. No pneumothorax. IMPRESSION: 1. Right lower lobe infiltrate noted consistent pneumonia. Findings progressed from prior exam. Associated small right pleural effusion. 2. Cannot exclude mild left base infiltrate with tiny left pleural effusion . 3. Mild cardiomegaly. Coronary stents noted. No pulmonary venous congestion. Electronically Signed   By: Maisie Fus  Register   On: 05/30/2017 16:19    Assessment/Plan Right lower lobe infiltrate concerning for pneumonia/aspiration pneumonia. Subsequent chest x-ray findings also concerning for possible volume overload. She has been admitted for several days now and has gotten IV fluids. EKG with nonspecific changes/possible ischemia. She has issues swallowing. She currently has UTI with Escherichia coli. Her pro calcitonin is normal.  - I will continue with Rocephin to treat for community-acquired pneumonia. Depending on how she does clinically, we will adjust antibiotics. Rocephin and also treat UTI. - Follow up on the blood culture and sputum culture. - Check urine for Legionella and streptococcus. - We'll put her on Pulmicort neb 0.5 twice a day as well  as DuoNeb 4 times a day. If she does not tolerate this, we can DC Pulmicort and make DuoNeb when necessary. - Continue with chest PT. - Consider diuresis if she is not significantly improved in the morning. My gut sense is  there might be volume overload issues making her cough and short of breath and explaining the chest x-ray findings. Will order echo as well as BNP.  - Aspiration precautions. - She may need a swallow evaluation  DVT prophylaxis.    Family :Family updated by Canary Brim this pm. She is a DNR.    Pollie Meyer, MD 05/30/2017, 5:06 PM [facility] Pager [phone] After 3 pm or if no  answer, call [phone]

## 2017-05-30 NOTE — Progress Notes (Signed)
PROGRESS NOTE    Sara Clark  ZOX:[id number] DOB: 14-Jul-1920 DOA: 05/28/2017 PCP: Merlene Laughter, MD   Brief Narrative: Sara Clark is a 83 y.o. female with a history of hypertension, CAD status post stent placement, macular degeneration, urinary incontinence. She presented with mental status change and was found to have a right lower lobe pneumonia likely secondary to aspiration. She also has a urinary tract infection from a previous encounter.   Assessment & Plan:   Principal Problem:   UTI (urinary tract infection) Active Problems:   Hypertension   CAP (community acquired pneumonia)   Acute metabolic encephalopathy   Sepsis (HCC)   CAD (coronary artery disease)   Abdominal pain   Urinary tract infection Culture from 5/28 significant for E. Coli sensitive to cephalosporin.  -continue Ceftriaxone  Community acquired pneumonia RLL. Possibly related to aspiration. CT abdomen significant for evidence of distal esophageal dysmotility/GERD (patient has a history of GERD). -continue ceftriaxone, doxycycline and metronidazole -flutter valve -incentive spirometry  Lingular nodule 4mm. Can consider repeat non-contrast CT chest in 12 months  CAD S/p stent. No chest pain -continue aspirin  Essential hypertension -continue atenolol and amlodipine  Abdominal pain CT abdomen significant for cholelithiasis without cholecystitis. Symptoms resolved.   DVT prophylaxis: Lovenox Code Status: DNR/DNI Family Communication: Grandson via telephone Disposition Plan: Discharge likely in 48-72 hours   Consultants:   Pulmonology  Procedures:   None  Antimicrobials:  Ceftriaxone  Doxycycline  Metronidazole    Subjective: Patient reports some coughing. No chest pain or dyspnea.  Objective: Vitals:   05/29/17 1523 05/29/17 2031 05/30/17 0415 05/30/17 0830  BP: (!) 124/47 (!) 145/55 (!) 131/58 (!) 131/47  Pulse: 80 92 82 92  Resp: 18 18 18 18   Temp: 98.5 F  (36.9 C) 98.9 F (37.2 C) 98.8 F (37.1 C) 98.4 F (36.9 C)  TempSrc: Oral Oral Oral Oral  SpO2: 97% 95% 93% 92%  Weight:      Height:        Intake/Output Summary (Last 24 hours) at 05/30/17 1307 Last data filed at 05/30/17 0426  Gross per 24 hour  Intake             1415 ml  Output                0 ml  Net             1415 ml   Filed Weights   05/29/17 0236  Weight: 60.9 kg (134 lb 4.2 oz)    Examination:  General exam: Appears calm and comfortable Respiratory system: Rhonchorus/congested. Respiratory effort normal. Cardiovascular system: S1 & S2 heard, RRR. No murmurs, rubs, gallops or clicks. Gastrointestinal system: Abdomen is nondistended, soft and nontender. No organomegaly or masses felt. Normal bowel sounds heard. Central nervous system: Alert and oriented. No focal neurological deficits. Extremities: No edema. No calf tenderness Skin: No cyanosis. No rashes Psychiatry: Judgement and insight appear normal. Mood & affect appropriate.     Data Reviewed: I have personally reviewed following labs and imaging studies  CBC:  Recent Labs Lab 05/27/17 2134 05/28/17 2235 05/30/17 1032  WBC 11.1* 14.3* 14.1*  NEUTROABS 9.0* 11.9*  --   HGB 14.3 13.0 11.5*  HCT 41.2 37.2 32.9*  MCV 92.4 90.5 89.9  PLT 180 181 208   Basic Metabolic Panel:  Recent Labs Lab 05/27/17 2134 05/28/17 2236 05/30/17 1032  NA 131* 131* 130*  K 4.3 4.1 3.6  CL 98* 100* 102  CO2 24 24  19*  GLUCOSE 141* 122* 162*  BUN 18 13 10   CREATININE 0.77 0.73 0.70  CALCIUM 10.0 9.5 8.4*   GFR: Estimated Creatinine Clearance: 36.9 mL/min (by C-G formula based on SCr of 0.7 mg/dL). Liver Function Tests:  Recent Labs Lab 05/27/17 2134 05/28/17 2236  AST 27 28  ALT 17 20  ALKPHOS 82 80  BILITOT 0.5 0.5  PROT 7.3 6.7  ALBUMIN 3.9 3.3*    Recent Labs Lab 05/29/17 0631  LIPASE 25   No results for input(s): AMMONIA in the last 168 hours. Coagulation Profile: No results for  input(s): INR, PROTIME in the last 168 hours. Cardiac Enzymes: No results for input(s): CKTOTAL, CKMB, CKMBINDEX, TROPONINI in the last 168 hours. BNP (last 3 results) No results for input(s): PROBNP in the last 8760 hours. HbA1C: No results for input(s): HGBA1C in the last 72 hours. CBG: No results for input(s): GLUCAP in the last 168 hours. Lipid Profile: No results for input(s): CHOL, HDL, LDLCALC, TRIG, CHOLHDL, LDLDIRECT in the last 72 hours. Thyroid Function Tests: No results for input(s): TSH, T4TOTAL, FREET4, T3FREE, THYROIDAB in the last 72 hours. Anemia Panel: No results for input(s): VITAMINB12, FOLATE, FERRITIN, TIBC, IRON, RETICCTPCT in the last 72 hours. Sepsis Labs:  Recent Labs Lab 05/27/17 2242 05/28/17 2332 05/29/17 0204  PROCALCITON  --   --  <0.10  LATICACIDVEN 0.75 0.94 0.9    Recent Results (from the past 240 hour(s))  Urine culture     Status: Abnormal   Collection Time: 05/27/17 10:00 PM  Result Value Ref Range Status   Specimen Description URINE, RANDOM  Final   Special Requests NONE  Final   Culture >=100,000 COLONIES/mL ESCHERICHIA COLI (A)  Final   Report Status 05/30/2017 FINAL  Final   Organism ID, Bacteria ESCHERICHIA COLI (A)  Final      Susceptibility   Escherichia coli - MIC*    AMPICILLIN 4 SENSITIVE Sensitive     CEFAZOLIN <=4 SENSITIVE Sensitive     CEFTRIAXONE <=1 SENSITIVE Sensitive     CIPROFLOXACIN >=4 RESISTANT Resistant     GENTAMICIN 4 SENSITIVE Sensitive     IMIPENEM 0.5 SENSITIVE Sensitive     NITROFURANTOIN <=16 SENSITIVE Sensitive     TRIMETH/SULFA <=20 SENSITIVE Sensitive     AMPICILLIN/SULBACTAM <=2 SENSITIVE Sensitive     PIP/TAZO <=4 SENSITIVE Sensitive     Extended ESBL NEGATIVE Sensitive     * >=100,000 COLONIES/mL ESCHERICHIA COLI  MRSA PCR Screening     Status: None   Collection Time: 05/29/17  3:50 AM  Result Value Ref Range Status   MRSA by PCR NEGATIVE NEGATIVE Final    Comment:        The GeneXpert MRSA  Assay (FDA approved for NASAL specimens only), is one component of a comprehensive MRSA colonization surveillance program. It is not intended to diagnose MRSA infection nor to guide or monitor treatment for MRSA infections.          Radiology Studies: Dg Chest 2 View  Result Date: 05/29/2017 CLINICAL DATA:  83 year old female with productive cough for about a week and fever. EXAM: CHEST  2 VIEW COMPARISON:  Chest radiograph dated 05/27/2017 FINDINGS: There is shallow inspiration. There are hazy bibasilar densities, right greater than left which appear more prominent and more confluent compared to the prior radiograph. This may represent atelectatic changes/scarring appearing more prominent secondary to decreased inspiratory effort compared to prior radiograph. However, developing infiltrate is not excluded. Correlation with clinical exam is  recommended. There is no pleural effusion or pneumothorax. Probable mild bronchiectatic changes. The cardiac silhouette is within normal limits. Coronary vascular stent noted. There is atherosclerotic calcification of the thoracic aorta. There is osteopenia with degenerative changes of the spine. No acute osseous pathology. IMPRESSION: Hazy bibasilar densities, right greater than left may represent atelectatic changes, although developing infiltrate is not excluded. Correlation with clinical exam recommended. Electronically Signed   By: Elgie CollardArash  Radparvar M.D.   On: 05/29/2017 00:34   Ct Abdomen Pelvis W Contrast  Result Date: 05/29/2017 CLINICAL DATA:  Abdominal pain. Admitted for urinary tract infection and probable pneumonia. Sepsis clinically. Metabolic encephalopathy. EXAM: CT ABDOMEN AND PELVIS WITH CONTRAST TECHNIQUE: Multidetector CT imaging of the abdomen and pelvis was performed using the standard protocol following bolus administration of intravenous contrast. CONTRAST:  80mL ISOVUE-300 IOPAMIDOL (ISOVUE-300) INJECTION 61% COMPARISON:  Chest  radiographs including earlier today. FINDINGS: Lower chest: Motion degradation at the lung bases. Patchy right lower lobe airspace disease. Minimal left lower lobe dependent atelectasis. Probable scarring in the inferior right middle lobe and posterior lingula. The posterior lingular nodule measures 4 mm on image 19/series 6. Normal heart size with multivessel coronary artery atherosclerosis. Small hiatal hernia with subtle contrast level in the distal esophagus on image 11/series 2. Trace right pleural fluid. Hepatobiliary: Calcification in the liver is likely related to old granulomatous disease. The gallbladder is stone filled, without surrounding inflammation or biliary duct dilatation. Pancreas: Mild fatty replacement involving the pancreatic body, head, and uncinate process. No duct dilatation or dominant mass. Spleen: Old granulomatous disease in the spleen. Adrenals/Urinary Tract: Normal adrenal glands. Partially malrotated right kidney. Too small to characterize lesions in both kidneys. No hydronephrosis. Normal urinary bladder. Stomach/Bowel: Normal remainder of the stomach. Mild mid abdominal motion degradation. Extensive colonic diverticulosis. Wall thickening involving the sigmoid is likely related to muscular hypertrophy. No convincing evidence of surrounding inflammation Normal terminal ileum. Appendix is not visualized but there is no evidence of right lower quadrant inflammation. Normal small bowel. Vascular/Lymphatic: Advanced aortic and branch vessel atherosclerosis. No abdominopelvic adenopathy. Reproductive: Hysterectomy.  No adnexal mass. Other: No significant free fluid. Mild pelvic floor laxity. Tiny fat containing right inguinal hernia. No free intraperitoneal air. Gas in the anterior abdominal wall is eccentric right and likely iatrogenic on image 33/series 2. Musculoskeletal: Mild osteopenia. Lumbosacral spondylosis with likely secondary trace L4-5 anterolisthesis. Multilevel disc bulges.  IMPRESSION: 1. Mildly motion degraded exam. 2. Cholelithiasis. No evidence of acute cholecystitis and no other explanation for abdominal pain. 3. Right lower lobe airspace disease is most consistent with either infection or aspiration. Small hiatal hernia with distal esophageal contrast level suggests dysmotility or gastroesophageal reflux and could predispose the patient to aspiration. 4. Trace right pleural fluid. 5.  Coronary artery atherosclerosis. Aortic atherosclerosis. 6. 4 mm lingular nodule. No follow-up needed if patient is low-risk. Non-contrast chest CT can be considered in 12 months if patient is high-risk. This recommendation follows the consensus statement: Guidelines for Management of Incidental Pulmonary Nodules Detected on CT Images: From the Fleischner Society 2017; Radiology 2017; 284:228-243. Electronically Signed   By: Jeronimo GreavesKyle  Talbot M.D.   On: 05/29/2017 12:22        Scheduled Meds: . amLODipine  5 mg Oral Daily  . aspirin EC  81 mg Oral Q3 days  . atenolol  25 mg Oral BID  . dextromethorphan-guaiFENesin  1 tablet Oral BID  . enoxaparin (LOVENOX) injection  40 mg Subcutaneous Q24H  . feeding supplement (ENSURE ENLIVE)  237 mL Oral BID BM  . multivitamin  1 tablet Oral Daily  . multivitamin with minerals  1 tablet Oral Daily  . oxybutynin  5 mg Oral BID  . senna  1 tablet Oral BID   Continuous Infusions: . cefTRIAXone (ROCEPHIN)  IV Stopped (05/29/17 2159)  . doxycycline (VIBRAMYCIN) IV    . metronidazole Stopped (05/30/17 8295)     LOS: 1 day     Jacquelin Hawking, MD [facility] 05/30/2017, 1:07 PM Pager: [phone]  If 7PM-7AM, please contact night-coverage [email or web address] Password [email or web address] 05/30/2017, 1:07 PM

## 2017-05-31 ENCOUNTER — Inpatient Hospital Stay (HOSPITAL_COMMUNITY): Payer: Medicare Other

## 2017-05-31 ENCOUNTER — Telehealth: Payer: Self-pay | Admitting: *Deleted

## 2017-05-31 DIAGNOSIS — I10 Essential (primary) hypertension: Secondary | ICD-10-CM

## 2017-05-31 DIAGNOSIS — R109 Unspecified abdominal pain: Secondary | ICD-10-CM

## 2017-05-31 DIAGNOSIS — J189 Pneumonia, unspecified organism: Secondary | ICD-10-CM

## 2017-05-31 DIAGNOSIS — I2510 Atherosclerotic heart disease of native coronary artery without angina pectoris: Secondary | ICD-10-CM

## 2017-05-31 DIAGNOSIS — I5030 Unspecified diastolic (congestive) heart failure: Secondary | ICD-10-CM

## 2017-05-31 DIAGNOSIS — G9349 Other encephalopathy: Secondary | ICD-10-CM

## 2017-05-31 DIAGNOSIS — J181 Lobar pneumonia, unspecified organism: Secondary | ICD-10-CM

## 2017-05-31 DIAGNOSIS — G9341 Metabolic encephalopathy: Secondary | ICD-10-CM

## 2017-05-31 DIAGNOSIS — B999 Unspecified infectious disease: Secondary | ICD-10-CM

## 2017-05-31 DIAGNOSIS — N3000 Acute cystitis without hematuria: Secondary | ICD-10-CM

## 2017-05-31 LAB — CBC WITH DIFFERENTIAL/PLATELET
Basophils Absolute: 0 10*3/uL (ref 0.0–0.1)
Basophils Relative: 0 %
EOS ABS: 0.1 10*3/uL (ref 0.0–0.7)
Eosinophils Relative: 1 %
HCT: 34.5 % — ABNORMAL LOW (ref 36.0–46.0)
Hemoglobin: 12.3 g/dL (ref 12.0–15.0)
LYMPHS ABS: 0.8 10*3/uL (ref 0.7–4.0)
LYMPHS PCT: 6 %
MCH: 31.4 pg (ref 26.0–34.0)
MCHC: 35.7 g/dL (ref 30.0–36.0)
MCV: 88 fL (ref 78.0–100.0)
MONO ABS: 1.5 10*3/uL — AB (ref 0.1–1.0)
MONOS PCT: 11 %
Neutro Abs: 11.4 10*3/uL — ABNORMAL HIGH (ref 1.7–7.7)
Neutrophils Relative %: 82 %
PLATELETS: 247 10*3/uL (ref 150–400)
RBC: 3.92 MIL/uL (ref 3.87–5.11)
RDW: 13.3 % (ref 11.5–15.5)
WBC: 13.8 10*3/uL — ABNORMAL HIGH (ref 4.0–10.5)

## 2017-05-31 LAB — EXPECTORATED SPUTUM ASSESSMENT W REFEX TO RESP CULTURE

## 2017-05-31 LAB — BASIC METABOLIC PANEL
Anion gap: 9 (ref 5–15)
BUN: 10 mg/dL (ref 6–20)
CO2: 20 mmol/L — ABNORMAL LOW (ref 22–32)
CREATININE: 0.56 mg/dL (ref 0.44–1.00)
Calcium: 8.8 mg/dL — ABNORMAL LOW (ref 8.9–10.3)
Chloride: 101 mmol/L (ref 101–111)
GFR calc Af Amer: >60 mL/min (ref >60)
GLUCOSE: 108 mg/dL — AB (ref 65–99)
Potassium: 3.5 mmol/L (ref 3.5–5.1)
SODIUM: 130 mmol/L — AB (ref 135–145)

## 2017-05-31 LAB — EXPECTORATED SPUTUM ASSESSMENT W GRAM STAIN, RFLX TO RESP C: Special Requests: NORMAL

## 2017-05-31 LAB — ECHOCARDIOGRAM COMPLETE
Height: 62 in
WEIGHTICAEL: 2148.16 [oz_av]

## 2017-05-31 LAB — PHOSPHORUS: Phosphorus: 1.8 mg/dL — ABNORMAL LOW (ref 2.5–4.6)

## 2017-05-31 LAB — BRAIN NATRIURETIC PEPTIDE: B Natriuretic Peptide: 315.6 pg/mL — ABNORMAL HIGH (ref 0.0–100.0)

## 2017-05-31 LAB — MAGNESIUM: MAGNESIUM: 1.7 mg/dL (ref 1.7–2.4)

## 2017-05-31 MED ORDER — FUROSEMIDE 10 MG/ML IJ SOLN
20.0000 mg | Freq: Once | INTRAMUSCULAR | Status: AC
  Administered 2017-05-31: 20 mg via INTRAVENOUS
  Filled 2017-05-31: qty 2

## 2017-05-31 NOTE — Progress Notes (Signed)
Name: Sara OaksLillie Demorest MRN: [id number] DOB: 1920/12/21    ADMISSION DATE:  05/28/2017 CONSULTATION DATE:  05/30/17  REFERRING MD :  Dr. Caleb PoppNettey / [facility]   CHIEF COMPLAINT: Confusion, PNA.   HISTORY OF PRESENT ILLNESS:  83 y/o F who [facility] with confusion.  Her family reported that she had recently had a URI and had managed with Mucinex.  The entire family was sick from the viral illness.  She was worked up and found to have fever & urinalysis consistent with UTI.  She was given antibiotics (Keflex) for UTI and discharged with family.    At baseline, she gets around with a walker.  [place] an apartment.  She no longer drives.  She is a never smoker but was exposed to second hand smoke from childhood into her marriage until her husband passed.       She returned to the ER on 5/29 via EMS with reports of lower abdominal pain, weakness, productive cough and increased confusion. Prior urine culture from 5/28 grew > 100k E-Coli (S- rocephin, R- Cipro).  CXR was evaluated on admit and was concerning for RLL infiltrate.  Initial WBC 11.1 > 14.1 on 5/31, negative lactate, negative PCT.  CT of the abdomen was completed with reports of abdominal pain which showed cholelithiasis without evidence of acute cholecystitis, RLL airspace disease, small hiatal hernia with distal esophageal contrast level suggestive of dysmotility or GERD, trace right pleural fluid, CAD, 4mm lingular nodule.  The patient [facility].  She was treated with IV doxycycline, rocephin and flagyl.  The patient remains on room air with saturations documented from 92-97%.  She was evaluated 5/31 by SLP for dysphagia and found to need a dysphagia 2 (fine chop) diet with thin liquids.    PCCM consulted for evaluation of PNA.      SUBJECTIVE:  She looks better and feels better.  Tolerating neb meds + CPT.  Had swallow evaln this  am   VITAL SIGNS: Temp:  [98.8 F (37.1 C)-99.3 F (37.4 C)] 98.8 F (37.1 C) (06/01 1401) Pulse Rate:  [77-86] 77 (06/01 1401) Resp:  [18-20] 18 (06/01 1401) BP: (130-142)/(51-68) 130/68 (06/01 1401) SpO2:  [90 %-96 %] 92 % (06/01 1401)  PHYSICAL EXAMINATION: General: frail elderly female in NAD, sleeping upon entering the room  HEENT: MM pink/moist, no jvd PSY: calm/appropriate Neuro: AAOx4, speech clear, MAE  CV: s1s2 rrr, no m/r/g PULM: even/non-labored, lungs bilaterally with coarse rhonchi  WU:JWJXGI:soft, non-tender, bsx4 active  Extremities: warm/dry, no edema  Skin: no rashes or lesions    Recent Labs Lab 05/28/17 2236 05/30/17 1032 05/31/17 0605  NA 131* 130* 130*  K 4.1 3.6 3.5  CL 100* 102 101  CO2 24 19* 20*  BUN 13 10 10   CREATININE 0.73 0.70 0.56  GLUCOSE 122* 162* 108*     Recent Labs Lab 05/28/17 2235 05/30/17 1032 05/31/17 0605  HGB 13.0 11.5* 12.3  HCT 37.2 32.9* 34.5*  WBC 14.3* 14.1* 13.8*  PLT 181 208 247    Dg Chest 2 View  Result Date: 05/31/2017 CLINICAL DATA:  83 year old female with history productive cough. EXAM: CHEST  2 VIEW COMPARISON:  Chest x-ray 05/30/2017. FINDINGS: Ill-defined opacities in the lower lobes of the lungs bilaterally (right greater than left). Probable trace right pleural effusion. No significant left pleural effusion. No evidence of pulmonary edema. Heart size is normal. Upper  mediastinal contours are within normal limits. Atherosclerosis in the thoracic aorta. Coronary artery stents are noted. IMPRESSION: 1. Bibasilar opacities (right greater than left) very similar to prior study from 05/30/2017. On the right, this is compatible with pneumonia which appears to involve the entire right lower lobe at this time. On the left, some component of atelectasis is likely present, however, additional airspace disease is also suspected. 2. Trace right pleural effusion. 3. Aortic atherosclerosis. Electronically Signed   By: Trudie Reed M.D.   On: 05/31/2017 09:45   Dg Chest Port 1 View  Result Date: 05/30/2017 CLINICAL DATA:  Aspiration pneumonia. EXAM: PORTABLE CHEST 1 VIEW COMPARISON:  05/29/2017. FINDINGS: Heart size normal. Coronary stents noted. Right lower lobe infiltrate noted consistent with pneumonia. Findings progressed from prior exam. Associated small right pleural effusion. Mild left base infiltrate with small left pleural effusion cannot be completely excluded. No pneumothorax. IMPRESSION: 1. Right lower lobe infiltrate noted consistent pneumonia. Findings progressed from prior exam. Associated small right pleural effusion. 2. Cannot exclude mild left base infiltrate with tiny left pleural effusion . 3. Mild cardiomegaly. Coronary stents noted. No pulmonary venous congestion. Electronically Signed   By: Maisie Fus  Register   On: 05/30/2017 16:19      SIGNIFICANT EVENTS  5/28  ER visit, treated for UTI & discharged home  5/29  Return to ER via EMS with confusion, AMS.  Concern for PNA, admitted per [facility]  5/31  PCCM consulted for evaluation of PNA   STUDIES:  CT ABD/Pelvis 5/30 >> cholelithiasis without evidence of acute cholecystitis, RLL airspace disease, small hiatal hernia with distal esophageal contrast level suggestive of dysmotility or GERD, trace right pleural fluid, CAD, 4mm lingular nodule  ANTIBIOTICS:  Flagyl 5/30 >> 5/31 Doxycycline 5/30 >> 5/31 Rocephin 5/30 >>   CULTURES: UC 5/28 >> E-Coli >> S- rocephin, R- Cipro Urine Legionella 5/30 >>  Urine Strep Antigen 5/30 >> negative  BCx2 5/29 >>  Sputum 5/29 >>   ASSESSMENT / PLAN:  Right lower lobe infiltrate c/w pneumonia/aspiration pneumonia.  Concern for aspiration pneumonia Subsequent chest x-ray findings also concerning for possible volume overload. She is positive since admission. Concern for CHfpEF excaerbation E coli UTI   - she is improved.  - continue with Rocephin.  - Follow up on the blood culture and sputum  culture. - Follow up on  Legionella and streptococcus Ag in the urine - Cont  Pulmicort neb 0.5 twice a day as well as DuoNeb 4 times a day. - Continue with chest PT as scheduled.  - modify diet based on swallow evaluation - f/u on echo - suggest trial of lasix 40 mg IV x 1. D/C IVF if able.    DVT prophylaxis.    Family :Family updated by Canary Brim on 5/31. She is a DNR.  Anticipate, she may need a STR at the very least.    J. Alexis Frock, MD 05/31/2017, 2:33 PM [facility] Pager [phone] After 3 pm or if no answer, call [phone]

## 2017-05-31 NOTE — Progress Notes (Signed)
  Echocardiogram 2D Echocardiogram has been performed.  Leta JunglingCooper, Harrol Novello M 05/31/2017, 11:37 AM

## 2017-05-31 NOTE — Progress Notes (Signed)
MBS completed, full report to follow.  Pt with only minimal oropharyngeal dysphagia without aspiration/penetration of any consistency tested.  She did have trace pharyngeal residuals most notably with liquids.    Barium tablet taken with thin lodged at vallecular space - requiring 2 more thin liquid boluses to transit into esophagus.  Pt did not report overt sensation to barium tablet stasis but volitionally drank extra liquid to clear, therefore suspect some sensation present.  Of note, pt does report premorbid occasional difficulties with sensing pills lodging pointing near right vallecular space.    Recommend advance diet to maximize pt intake.  Pt states she needs foods that have liquids like pudding/applesauce - suspect moist foods desired and dry crumbly foods should be avoided.    Using monitor, educated pt to findings/recommendations.   Recommend:   Dys3/ground meats/thin Extra gravy/sauces Medicine with puree (whole)*start and follow with liquids Aspiration and esophageal precautions   Donavan Burnetamara Reilley Valentine, MS [facility] SLP   [phone]

## 2017-05-31 NOTE — Progress Notes (Signed)
Pt. placed on 2 lpm humidified oxygen after initial aerosol and use of chest vest, just prior room air sats with loose non-productive cough 90-92%, pt. made aware of therapy goals and tolerating well, has oder in place for sats >92%, RT to monitor.

## 2017-05-31 NOTE — Telephone Encounter (Signed)
Post ED Visit - Positive Culture Follow-up  Culture report reviewed by antimicrobial stewardship pharmacist:  [x]  Enzo BiNathan Batchelder, Pharm.D. []  Celedonio MiyamotoJeremy Frens, Pharm.D., BCPS AQ-ID []  Garvin FilaMike Maccia, Pharm.D., BCPS []  Georgina PillionElizabeth Martin, Pharm.D., BCPS []  [place]h Pham, [address] BoulevardPharm.D., BCPS, AAHIVP []  Estella HuskMichelle Turner, Pharm.D., BCPS, AAHIVP []  Lysle Pearlachel Rumbarger, PharmD, BCPS []  Casilda Carlsaylor Stone, PharmD, BCPS []  Pollyann SamplesAndy Johnston, PharmD, BCPS  Positive urine culture Treated with Cephalexin, organism sensitive to the same and no further patient follow-up is required at this time.  Virl AxeRobertson, Jeryn Bertoni [facility] 05/31/2017, 11:15 AM

## 2017-05-31 NOTE — Clinical Social Work Note (Signed)
Clinical Social Work Assessment  Patient Details  Name: Sara Clark MRN: [id number] Date of Birth: [birth year]  Date of referral:  05/31/17               Reason for consult:  Facility Placement                Permission sought to share information with:  Family Supports, Customer service manager Permission granted to share information::  Yes, Release of Information Signed  Name::     Set designer::  SNF  Relationship::  Grandson   Contact Information:  [phone]  Housing/Transportation Living arrangements for the past 2 months:  [place] of Information:  Adult Children Patient Interpreter Needed:  None Criminal Activity/Legal Involvement Pertinent to Current Situation/Hospitalization:  No - Comment as needed Significant Relationships:  Adult Children Lives with:  Facility Resident Do you feel safe going back to the place where you live?  Yes Need for family participation in patient care:  Yes (Comment)  Care giving concerns:  PT recommends patient follow up at rehab facility.    Social Worker assessment / plan:  CSW met with patient grandson Dr. Weber Cooks in office.  Patient grandson provided list of SNF facilities in the area that were close to family home and were known to provide good care. CSW educated patient grandson about facilities in the area, discussed faxing out process and provided a list of facilities for reference.  Plan: complete PASRR, Faxout and follow up with bed offers. Assist with discharge.   Employment status:  Retired Forensic scientist:  [employer] PT Recommendations:  [place] / Referral to community resources:  [place]  Patient/Family's Response to care:  Agreeable   Patient/Family's Understanding of and Emotional Response to Diagnosis, Current Treatment, and Prognosis:  " We want the best facility and care for her, a facility close to home."   Emotional  Assessment Appearance:    Attitude/Demeanor/Rapport:    Affect (typically observed):  Accepting Orientation:  Oriented to Self, Oriented to Place, Oriented to  Time, Oriented to Situation Alcohol / Substance use:  Not Applicable Psych involvement (Current and /or in the community):  No (Comment)  Discharge Needs  Concerns to be addressed:  Discharge Planning Concerns Readmission within the last 30 days:  No Current discharge risk:  None Barriers to Discharge:  Continued Medical Work up   [employer], [place] 05/31/2017, 10:28 AM

## 2017-05-31 NOTE — Progress Notes (Signed)
Initial Nutrition Assessment  DOCUMENTATION CODES:   Not applicable  INTERVENTION:    Continue Ensure Enlive po BID, each supplement provides 350 kcal and 20 grams of protein  NUTRITION DIAGNOSIS:   Inadequate oral intake related to dysphagia, poor appetite as evidenced by meal completion < 25%  GOAL:   Patient will meet greater than or equal to 90% of their needs  MONITOR:   PO intake, Supplement acceptance, Labs, Skin, I & O's, Weight trends  REASON FOR ASSESSMENT:   Consult Assessment of nutrition requirement/status  ASSESSMENT:    83 y.o. Female with PMH of hypertension, CAD, stent placement, macular degeneration, and urinary incontinence, who presents with altered mental status, cough and abdominal pain. She was found to have a UTI. CT also showed right lower lobe airspace disease most consistent with either infection or aspiration. Small hiatal hernia with distal esophageal contrast level suggests dysmotility or gastroesophageal reflux and could predispose the patient to aspiration.   Pt a bit confused upon RD visit.  Not very responsive to RD interview.   Lunch meal untouched on tray table.  S/p MBSS 6/1.  Minimal dysphagia.  Labs reviewed.  Sodium 130 (L).  Phosphorus 1.8 (L). Medications reviewed and include MVI. CGG's [id number].  Unable to complete Nutrition-Focused physical exam at this time.   Diet Order:  DIET DYS 2 Room service appropriate? Yes; Fluid consistency: Thin  Skin:  Reviewed, no issues  Last BM:  5/30  Height:   Ht Readings from Last 1 Encounters:  05/29/17 5\' 2"  (1.575 m)   Weight:   Wt Readings from Last 1 Encounters:  05/29/17 134 lb 4.2 oz (60.9 kg)   Ideal Body Weight:  50 kg  BMI:  Body mass index is 24.56 kg/m.  Estimated Nutritional Needs:   Kcal:  1200-1400  Protein:  65-75 gm  Fluid:  >/= 1.5 L  EDUCATION NEEDS:   No education needs identified at this time  Maureen ChattersKatie Vina Byrd, RD, LDN Pager #:  [phone] After-Hours Pager #: [phone]

## 2017-05-31 NOTE — Progress Notes (Signed)
Pt. spit out small/scant amount of thick Tan sputum into tissue with this RT unable to collect/place into sputum cup, RT to monitor.

## 2017-05-31 NOTE — Progress Notes (Signed)
PROGRESS NOTE    Sara Clark  ZOX:[id number] DOB: 1920-02-04 DOA: 05/28/2017 PCP: Merlene Laughter, MD   Brief Narrative: Sara Clark is a 83 y.o. female with a history of hypertension, CAD status post stent placement, macular degeneration, urinary incontinence. She presented with mental status change and was found to have a right lower lobe pneumonia likely secondary to aspiration. She also has a urinary tract infection from a previous encounter.  Assessment & Plan:   Principal Problem:   UTI (urinary tract infection) Active Problems:   Hypertension   CAP (community acquired pneumonia)   Acute metabolic encephalopathy   Sepsis (HCC)   CAD (coronary artery disease)   Abdominal pain   Aspiration pneumonia (HCC)   Pneumonia of right lower lobe due to infectious organism (HCC)   E. Coli Urinary tract infection -culture results from 5/28 demonstrated pan-sensitive E. Coli -will continue ceftriaxonecontinue Ceftriaxone  Community acquired pneumonia -CXR with RLL infiltrates; with concerns for aspiration -patient improving with use of rocephin -CT abdomen with findings suggesting esophageal dysmotility and patient had chronic hx of GERD -will continue flutter valve -continue pulmicort and duoneb\-will follow cx's results and clinical response -will follow pulmonary service rec's  Lingular nodule - nodule appreciated on admission -per radiology recommendations, patient will benefit of repeat non contrast CT chest in 12 months.   CAD -no CP, no acute ischemic changes seen on EKG or telemetry -will continue ASA  Essential hypertension -BP is well control -will continue amlodipine and atenolol  Abdominal pain -CT abdomen with cholelithiasis -there is no signs of acute cholecystitis -patient not longer complaining of abd pain.   DVT prophylaxis: Lovenox Code Status: DNR/DNI Family Communication: no family at bedside. Disposition Plan: Discharge likely in 48-72  hours   Consultants:   Pulmonology  Procedures:   None  Antimicrobials:  Ceftriaxone 5/29  Doxycycline and Flagyl 5/29>>5/31   Subjective: Feeling much better today and breathing easier. Denies CP and is afebrile.  Objective: Vitals:   05/31/17 1401 05/31/17 1511 05/31/17 2031 05/31/17 2136  BP: 130/68  (!) 143/49   Pulse: 77  86   Resp: 18  (!) 32   Temp: 98.8 F (37.1 C)  (!) 100.5 F (38.1 C)   TempSrc: Oral  Oral   SpO2: 92% 92% 97% 92%  Weight:      Height:        Intake/Output Summary (Last 24 hours) at 05/31/17 2224 Last data filed at 05/31/17 2030  Gross per 24 hour  Intake              120 ml  Output                5 ml  Net              115 ml   Filed Weights   05/29/17 0236  Weight: 60.9 kg (134 lb 4.2 oz)    Examination: General exam: patient feeling better and breathing easier. Denies CP. Still requiring 2L [place] oxygen supplementation. Respiratory system: no using accessory muscles, scattered rhonchi and decrease BS at bases bilaterally. No wheezing. Cardiovascular system: regular rate, no rubs, no gallops, no murmurs  Gastrointestinal system: soft, NT, ND, positive BS Central nervous system: no focal deficit appreciated, CN intact Extremities: no edema, no cyanosis, reported painful with palpation (which is chronic and due to neuropathy) Skin: no open wounds Psychiatry: oriented X3, appropriate mood   Data Reviewed: I have personally reviewed following labs and imaging studies  CBC:  Recent Labs Lab 05/27/17 2134 05/28/17 2235 05/30/17 1032 05/31/17 0605  WBC 11.1* 14.3* 14.1* 13.8*  NEUTROABS 9.0* 11.9*  --  11.4*  HGB 14.3 13.0 11.5* 12.3  HCT 41.2 37.2 32.9* 34.5*  MCV 92.4 90.5 89.9 88.0  PLT 180 181 208 247   Basic Metabolic Panel:  Recent Labs Lab 05/27/17 2134 05/28/17 2236 05/30/17 1032 05/31/17 0605  NA 131* 131* 130* 130*  K 4.3 4.1 3.6 3.5  CL 98* 100* 102 101  CO2 24 24 19* 20*  GLUCOSE 141* 122* 162* 108*   BUN 18 13 10 10   CREATININE 0.77 0.73 0.70 0.56  CALCIUM 10.0 9.5 8.4* 8.8*  MG  --   --   --  1.7  PHOS  --   --   --  1.8*   GFR: Estimated Creatinine Clearance: 36.9 mL/min (by C-G formula based on SCr of 0.56 mg/dL).   Liver Function Tests:  Recent Labs Lab 05/27/17 2134 05/28/17 2236  AST 27 28  ALT 17 20  ALKPHOS 82 80  BILITOT 0.5 0.5  PROT 7.3 6.7  ALBUMIN 3.9 3.3*    Recent Labs Lab 05/29/17 0631  LIPASE 25   Sepsis Labs:  Recent Labs Lab 05/27/17 2242 05/28/17 2332 05/29/17 0204  PROCALCITON  --   --  <0.10  LATICACIDVEN 0.75 0.94 0.9    Recent Results (from the past 240 hour(s))  Urine culture     Status: Abnormal   Collection Time: 05/27/17 10:00 PM  Result Value Ref Range Status   Specimen Description URINE, RANDOM  Final   Special Requests NONE  Final   Culture >=100,000 COLONIES/mL ESCHERICHIA COLI (A)  Final   Report Status 05/30/2017 FINAL  Final   Organism ID, Bacteria ESCHERICHIA COLI (A)  Final      Susceptibility   Escherichia coli - MIC*    AMPICILLIN 4 SENSITIVE Sensitive     CEFAZOLIN <=4 SENSITIVE Sensitive     CEFTRIAXONE <=1 SENSITIVE Sensitive     CIPROFLOXACIN >=4 RESISTANT Resistant     GENTAMICIN 4 SENSITIVE Sensitive     IMIPENEM 0.5 SENSITIVE Sensitive     NITROFURANTOIN <=16 SENSITIVE Sensitive     TRIMETH/SULFA <=20 SENSITIVE Sensitive     AMPICILLIN/SULBACTAM <=2 SENSITIVE Sensitive     PIP/TAZO <=4 SENSITIVE Sensitive     Extended ESBL NEGATIVE Sensitive     * >=100,000 COLONIES/mL ESCHERICHIA COLI  Blood culture (routine x 2)     Status: None (Preliminary result)   Collection Time: 05/28/17 10:40 PM  Result Value Ref Range Status   Specimen Description BLOOD LEFT ANTECUBITAL  Final   Special Requests   Final    BOTTLES DRAWN AEROBIC AND ANAEROBIC Blood Culture adequate volume   Culture   Final    NO GROWTH 2 DAYS Performed at [facility], [address] [place], [place] [address]    Report Status  PENDING  Incomplete  Blood culture (routine x 2)     Status: None (Preliminary result)   Collection Time: 05/28/17 10:45 PM  Result Value Ref Range Status   Specimen Description BLOOD RIGHT ANTECUBITAL  Final   Special Requests   Final    BOTTLES DRAWN AEROBIC AND ANAEROBIC Blood Culture adequate volume   Culture   Final    NO GROWTH 2 DAYS Performed at [facility], [address] [place], [place] [address]    Report Status PENDING  Incomplete  MRSA PCR Screening  Status: None   Collection Time: 05/29/17  3:50 AM  Result Value Ref Range Status   MRSA by PCR NEGATIVE NEGATIVE Final    Comment:        The GeneXpert MRSA Assay (FDA approved for NASAL specimens only), is one component of a comprehensive MRSA colonization surveillance program. It is not intended to diagnose MRSA infection nor to guide or monitor treatment for MRSA infections.   Culture, expectorated sputum-assessment     Status: None   Collection Time: 05/31/17 10:07 AM  Result Value Ref Range Status   Specimen Description SPUTUM  Final   Special Requests Normal  Final   Sputum evaluation THIS SPECIMEN IS ACCEPTABLE FOR SPUTUM CULTURE  Final   Report Status 05/31/2017 FINAL  Final  Culture, respiratory (NON-Expectorated)     Status: None (Preliminary result)   Collection Time: 05/31/17 10:07 AM  Result Value Ref Range Status   Specimen Description SPUTUM  Final   Special Requests Normal Reflexed from [id number]  Final   Gram Stain   Final    MODERATE WBC PRESENT,BOTH PMN AND MONONUCLEAR NO ORGANISMS SEEN Performed at [facility], [address], [place], [place] [address]    Culture PENDING  Incomplete   Report Status PENDING  Incomplete      Radiology Studies: Dg Chest 2 View  Result Date: 05/31/2017 CLINICAL DATA:  83 year old female with history productive cough. EXAM: CHEST  2 VIEW COMPARISON:  Chest x-ray 05/30/2017. FINDINGS: Ill-defined opacities in the lower lobes of the lungs  bilaterally (right greater than left). Probable trace right pleural effusion. No significant left pleural effusion. No evidence of pulmonary edema. Heart size is normal. Upper mediastinal contours are within normal limits. Atherosclerosis in the thoracic aorta. Coronary artery stents are noted. IMPRESSION: 1. Bibasilar opacities (right greater than left) very similar to prior study from 05/30/2017. On the right, this is compatible with pneumonia which appears to involve the entire right lower lobe at this time. On the left, some component of atelectasis is likely present, however, additional airspace disease is also suspected. 2. Trace right pleural effusion. 3. Aortic atherosclerosis. Electronically Signed   By: Trudie Reed M.D.   On: 05/31/2017 09:45   Dg Chest Port 1 View  Result Date: 05/30/2017 CLINICAL DATA:  Aspiration pneumonia. EXAM: PORTABLE CHEST 1 VIEW COMPARISON:  05/29/2017. FINDINGS: Heart size normal. Coronary stents noted. Right lower lobe infiltrate noted consistent with pneumonia. Findings progressed from prior exam. Associated small right pleural effusion. Mild left base infiltrate with small left pleural effusion cannot be completely excluded. No pneumothorax. IMPRESSION: 1. Right lower lobe infiltrate noted consistent pneumonia. Findings progressed from prior exam. Associated small right pleural effusion. 2. Cannot exclude mild left base infiltrate with tiny left pleural effusion . 3. Mild cardiomegaly. Coronary stents noted. No pulmonary venous congestion. Electronically Signed   By: Maisie Fus  Register   On: 05/30/2017 16:19   Dg Swallowing Func-speech Pathology  Result Date: 05/31/2017 Objective Swallowing Evaluation: Type of Study: MBS-Modified Barium Swallow Study Patient Details Name: Scarleth Brame MRN: [id number] Date of Birth: 1920-10-29 Today's Date: 05/31/2017 Time: SLP Start Time (ACUTE ONLY): 1150-SLP Stop Time (ACUTE ONLY): 1225 SLP Time Calculation (min) (ACUTE ONLY): 35  min Past Medical History: Past Medical History: Diagnosis Date . Hypertension  . Macular degeneration  . Myocardial infarct (HCC)  . Urinary incontinence  . Venous stasis  Past Surgical History: Past Surgical History: Procedure Laterality Date . CORONARY ANGIOPLASTY WITH STENT PLACEMENT   HPI: Ptis  a 83 y.o.femalewith medical history significant for hypertension, CAD, stent placement, macular degeneration, and urinary incontinence, who presents with altered mental status, cough and abdominal pain- diagnosed with UTI, pna and encephalopathy.   Grandson reports concern for pt having some baseline dysphagia as he has witnessed her coughing during meals on occasion.  In addition, pt admits to pill dysphagia, stating they lodge in throat and require her to bring them back up into mouth and swallow again.  Pt pointed near right vallecular region when articulating re: pill stasis.  She denies weight loss, admits to requiring heimlich manuever x1 approximately 20 years ago due to choking on steak.  Pt denies problems swallowing foods or liquids but admits to some reflux symptoms.  CT also showed right lower lobe airspace disease most consistent with either infection or aspiration. Per Abdomen CT, pt also with "Small hiatal hernia with distal esophageal contrast level suggests dysmotility or gastroesophageal reflux and could predispose the patient to aspiration."  Clinical swallow evaluation completed with concerns for primary esophageal symptoms/signs - MBS ordered to rule out oropharyngeal deficit.   Subjective: pt alert Assessment / Plan / Recommendation CHL IP CLINICAL IMPRESSIONS 05/31/2017 Clinical Impression Pt with only minimal oropharyngeal dysphagia without aspiration/penetration of any consistency tested.  She did have trace pharyngeal residuals most notably with liquids. Barium tablet taken with thin lodged at vallecular space - requiring 2 more thin liquid boluses to transit into esophagus.  Pt did not report  overt sensation to barium tablet stasis but volitionally drank extra liquid to clear, therefore suspect some sensation present.  Of note, pt does report premorbid occasional difficulties with sensing pills lodging pointing near right vallecular space.  Recommend advance diet to maximize pt intake.  Pt states she needs foods that have liquids like pudding/applesauce - suspect moist foods desired and dry crumbly foods should be avoided.  Using video monitor, educated pt to findings/recommendations.    SLP Visit Diagnosis Dysphagia, unspecified (R13.10) Attention and concentration deficit following -- Frontal lobe and executive function deficit following -- Impact on safety and function Mild aspiration risk;Moderate aspiration risk   CHL IP TREATMENT RECOMMENDATION 05/31/2017 Treatment Recommendations Therapy as outlined in treatment plan below   Prognosis 05/31/2017 Prognosis for Safe Diet Advancement Good Barriers to Reach Goals -- Barriers/Prognosis Comment -- CHL IP DIET RECOMMENDATION 05/31/2017 SLP Diet Recommendations Dysphagia 3 (Mech soft) solids;Thin liquid, extra gravies/sauces Liquid Administration via Cup;Straw Medication Administration Whole meds with puree- start and follow with liquids Compensations Slow rate;Small sips/bites;Follow solids with liquid, start meals with liquids Postural Changes Remain semi-upright after after feeds/meals (Comment);Seated upright at 90 degrees   No flowsheet data found.  CHL IP FOLLOW UP RECOMMENDATIONS 05/31/2017 Follow up Recommendations None   CHL IP FREQUENCY AND DURATION 05/31/2017 Speech Therapy Frequency (ACUTE ONLY) min 2x/week Treatment Duration 2 weeks      CHL IP ORAL PHASE 05/31/2017 Oral Phase Impaired Oral - Pudding Teaspoon -- Oral - Pudding Cup -- Oral - Honey Teaspoon -- Oral - Honey Cup -- Oral - Nectar Teaspoon -- Oral - Nectar Cup WFL Oral - Nectar Straw -- Oral - Thin Teaspoon WFL Oral - Thin Cup WFL Oral - Thin Straw WFL Oral - Puree WFL;Piecemeal  swallowing;Premature spillage Oral - Mech Soft -- Oral - Regular WFL;Premature spillage Oral - Multi-Consistency -- Oral - Pill Reduced posterior propulsion Oral Phase - Comment --  CHL IP PHARYNGEAL PHASE 05/31/2017 Pharyngeal Phase Impaired Pharyngeal- Pudding Teaspoon -- Pharyngeal -- Pharyngeal- Pudding Cup -- Pharyngeal --  Pharyngeal- Honey Teaspoon -- Pharyngeal -- Pharyngeal- Honey Cup -- Pharyngeal -- Pharyngeal- Nectar Teaspoon -- Pharyngeal -- Pharyngeal- Nectar Cup WFL Pharyngeal -- Pharyngeal- Nectar Straw -- Pharyngeal -- Pharyngeal- Thin Teaspoon WFL;Pharyngeal residue - valleculae;Pharyngeal residue - pyriform Pharyngeal -- Pharyngeal- Thin Cup [facility];Pharyngeal residue - valleculae;Pharyngeal residue - pyriform Pharyngeal -- Pharyngeal- Thin Straw WFL;Pharyngeal residue - valleculae;Pharyngeal residue - pyriform Pharyngeal -- Pharyngeal- Puree WFL Pharyngeal -- Pharyngeal- Mechanical Soft -- Pharyngeal -- Pharyngeal- Regular WFL Pharyngeal -- Pharyngeal- Multi-consistency -- Pharyngeal -- Pharyngeal- Pill Reduced tongue base retraction;Pharyngeal residue - valleculae Pharyngeal -- Pharyngeal Comment pt did not admit to sensation of pill lodging in vallecular space but independently consumed 2 extra liquid boluses to transit into esophagus  CHL IP CERVICAL ESOPHAGEAL PHASE 05/31/2017 No flowsheet data found. Donavan Burnet, MS [facility] SLP [phone]                Scheduled Meds: . amLODipine  5 mg Oral Daily  . aspirin EC  81 mg Oral Q3 days  . atenolol  25 mg Oral BID  . budesonide (PULMICORT) nebulizer solution  0.5 mg Nebulization BID  . dextromethorphan-guaiFENesin  1 tablet Oral BID  . enoxaparin (LOVENOX) injection  40 mg Subcutaneous Q24H  . feeding supplement (ENSURE ENLIVE)  237 mL Oral BID BM  . ipratropium-albuterol  3 mL Nebulization QID  . multivitamin  1 tablet Oral Daily  . multivitamin with minerals  1 tablet Oral Daily  . oxybutynin  5 mg Oral BID  . senna  1 tablet Oral BID    Continuous Infusions: . cefTRIAXone (ROCEPHIN)  IV 1 g (05/31/17 2128)     LOS: 2 days     Vassie Loll, MD [facility] 05/31/2017, 10:24 PM Pager: [phone]  If 7PM-7AM, please contact night-coverage [email or web address] Password [facility] 05/31/2017, 10:24 PM

## 2017-05-31 NOTE — Progress Notes (Signed)
Physical Therapy Treatment Patient Details Name: Sara Clark MRN: [id number] DOB: Sep 10, 1920 Today's Date: 05/31/2017    History of Present Illness 83 y/o F who [facility] with confusion. Found to have fever & urinalysis consistent with UTI. She returned to the ER on 5/29 via EMS with reports of lower abdominal pain, weakness, productive cough and increased confusion    PT Comments    The patient  On 2 l [place] of oxygen. Tried RA for a few minutes with sats dropping to 87%, HR 115. Replaced to 2 l [place] then assisted to recliner. Patient's grandson present. . Continue PT while in acute care.  Follow Up Recommendations  SNF;Supervision/Assistance - 24 hour     Equipment Recommendations  None recommended by PT    Recommendations for Other Services       Precautions / Restrictions Precautions Precautions: Fall Precaution Comments: monitor sats, may need O2, incontinent. uses depends    Mobility  Bed Mobility Overal bed mobility: Needs Assistance Bed Mobility: Supine to Sit   Sidelying to sit: [facility] elevated;Min assist       General bed mobility comments: assist with trunk to sit up, extra time.  Qppears very weak, listless.  Transfers Overall transfer level: Needs assistance Equipment used: Rolling walker (2 wheeled) Transfers: Sit to/from Stand Sit to Stand: Mod assist         General transfer comment: steady assist to rise from the bed  Ambulation/Gait Ambulation/Gait assistance: Mod assist Ambulation Distance (Feet): 4 Feet Assistive device: Rolling walker (2 wheeled) Gait Pattern/deviations: Step-to pattern     General Gait Details: gait unsteady, assist to guide RW  and turn.    Stairs            Wheelchair Mobility    Modified Rankin (Stroke Patients Only)       Balance                                            Cognition Arousal/Alertness: Awake/alert Behavior During Therapy: Restless                                           Exercises      General Comments        Pertinent Vitals/Pain Pain Assessment: No/denies pain    Home Living                      Prior Function            PT Goals (current goals can now be found in the care plan section) Progress towards PT goals: Progressing toward goals    Frequency    Min 3X/week      PT Plan Current plan remains appropriate    Co-evaluation              AM-PAC PT "6 Clicks" Daily Activity  Outcome Measure  Difficulty turning over in bed (including adjusting bedclothes, sheets and blankets)?: A Little Difficulty moving from lying on back to sitting on the side of the bed? : A Little Difficulty sitting down on and standing up from a chair with arms (e.g., wheelchair, bedside commode, etc,.)?: A Lot   Help needed walking in hospital room?: A Lot Help needed  climbing 3-5 steps with a railing? : A Lot 6 Click Score: 12    End of Session Equipment Utilized During Treatment: Gait belt Activity Tolerance: Patient limited by fatigue Patient left: in chair;with call bell/phone within reach;with chair alarm set;with family/visitor present Nurse Communication: Mobility status PT Visit Diagnosis: Unsteadiness on feet (R26.81);Difficulty in walking, not elsewhere classified (R26.2)     Time: 1610-9604 PT Time Calculation (min) (ACUTE ONLY): 28 min  Charges:  $Therapeutic Activity: 23-37 mins                    G CodesBlanchard Kelch PT [phone]    Rada Hay 05/31/2017, 5:00 PM

## 2017-05-31 NOTE — [place] FL2 (Signed)
[place] MEDICAID FL2 LEVEL OF CARE SCREENING TOOL     IDENTIFICATION  Patient Name: Sara Clark Birthdate: 1920-01-22 Sex: female Admission Date (Current Location): 05/28/2017  [facility] and [place] Number:  Producer, television/film/video and Address:  [facility],  [address] [place], [place] [address]      Provider Number: [id number]  Attending Physician Name and Address:  Vassie Loll, MD  Relative Name and Phone Number:  Calea, Hribar  [phone]     Current Level of Care: Hospital Recommended Level of Care: Skilled Nursing Facility Prior Approval Number:    Date Approved/Denied:   PASRR Number: [id number]  Discharge Plan: SNF    Current Diagnoses: Patient Active Problem List   Diagnosis Date Noted  . Aspiration pneumonia (HCC)   . UTI (urinary tract infection) 05/29/2017  . CAP (community acquired pneumonia) 05/29/2017  . Acute metabolic encephalopathy 05/29/2017  . Sepsis (HCC) 05/29/2017  . CAD (coronary artery disease) 05/29/2017  . Abdominal pain 05/29/2017  . Hypertension     Orientation RESPIRATION BLADDER Height & Weight     Self, Time, Situation, Place  Normal Continent Weight: 134 lb 4.2 oz (60.9 kg) Height:  5\' 2"  (157.5 cm)  BEHAVIORAL SYMPTOMS/MOOD NEUROLOGICAL BOWEL NUTRITION STATUS      Continent  (Dyshagia 2 diet)  AMBULATORY STATUS COMMUNICATION OF NEEDS Skin   Limited Assist Verbally                         Personal Care Assistance Level of Assistance  Bathing, Feeding, Dressing Bathing Assistance: Limited assistance Feeding assistance: Independent Dressing Assistance: Limited assistance     Functional Limitations Info  Sight, Hearing, Speech Sight Info: Adequate Hearing Info: Adequate Speech Info: Adequate    SPECIAL CARE FACTORS FREQUENCY  PT (By licensed PT), Speech therapy     PT Frequency: Min 3X/week       Speech Therapy Frequency: min 2x/week       Contractures Contractures Info: Not present     Additional Factors Info  Code Status, Allergies Code Status Info: DNR Allergies Info: Erythromycin, Penicillins           Current Medications (05/31/2017):  This is the current hospital active medication list Current Facility-Administered Medications  Medication Dose Route Frequency Provider Last Rate Last Dose  . acetaminophen (TYLENOL) tablet 650 mg  650 mg Oral Q6H PRN Lorretta Harp, MD      . albuterol (PROVENTIL) (2.5 MG/3ML) 0.083% nebulizer solution 2.5 mg  2.5 mg Nebulization Q4H PRN Lorretta Harp, MD      . amLODipine (NORVASC) tablet 5 mg  5 mg Oral Daily Lorretta Harp, MD   5 mg at 05/31/17 1002  . aspirin EC tablet 81 mg  81 mg Oral Q3 days Lorretta Harp, MD   81 mg at 05/29/17 1310  . atenolol (TENORMIN) tablet 25 mg  25 mg Oral BID Lorretta Harp, MD   25 mg at 05/31/17 1002  . budesonide (PULMICORT) nebulizer solution 0.5 mg  0.5 mg Nebulization BID de [place], [place] A, MD   0.5 mg at 05/31/17 0817  . cefTRIAXone (ROCEPHIN) 1 g in dextrose 5 % 50 mL IVPB  1 g Intravenous Q24H Lorenza Evangelist, [facility]   Stopped at 05/30/17 2156  . dextromethorphan-guaiFENesin (MUCINEX DM) 30-600 MG per 12 hr tablet 1 tablet  1 tablet Oral BID Lorretta Harp, MD   1 tablet at 05/31/17 1002  . enoxaparin (LOVENOX) injection 40 mg  40  mg Subcutaneous Q24H Lorretta HarpNiu, Xilin, MD   40 mg at 05/31/17 1010  . feeding supplement (ENSURE ENLIVE) (ENSURE ENLIVE) liquid 237 mL  237 mL Oral BID BM Narda BondsNettey, Ralph A, MD   237 mL at 05/31/17 1011  . ipratropium-albuterol (DUONEB) 0.5-2.5 (3) MG/3ML nebulizer solution 3 mL  3 mL Nebulization QID de Dios, LeawoodJose Angelo A, MD   3 mL at 05/31/17 [id number]  . multivitamin (PROSIGHT) tablet 1 tablet  1 tablet Oral Daily Lorretta HarpNiu, Xilin, MD   1 tablet at 05/31/17 1011  . multivitamin with minerals tablet 1 tablet  1 tablet Oral Daily Lorretta HarpNiu, Xilin, MD   1 tablet at 05/31/17 1002  . ondansetron (ZOFRAN) injection 4 mg  4 mg Intravenous Q8H PRN Lorretta HarpNiu, Xilin, MD      . oxybutynin (DITROPAN) tablet 5 mg  5 mg  Oral BID Lorretta HarpNiu, Xilin, MD   5 mg at 05/31/17 1002  . senna (SENOKOT) tablet 8.6 mg  1 tablet Oral BID Lorretta HarpNiu, Xilin, MD   8.6 mg at 05/31/17 1002     Discharge Medications: Please see discharge summary for a list of discharge medications.  Relevant Imaging Results:  Relevant Lab Results:   Additional Information ssn:[id number]  Clearance CootsNicole A Heavenlee Maiorana, LCSW

## 2017-06-01 DIAGNOSIS — R5381 Other malaise: Secondary | ICD-10-CM

## 2017-06-01 MED ORDER — PANTOPRAZOLE SODIUM 20 MG PO TBEC
20.0000 mg | DELAYED_RELEASE_TABLET | Freq: Every day | ORAL | Status: DC
  Administered 2017-06-02 – 2017-06-03 (×2): 20 mg via ORAL
  Filled 2017-06-01 (×2): qty 1

## 2017-06-01 NOTE — Progress Notes (Signed)
PROGRESS NOTE    Sara Clark  ZOX:[id number] DOB: 1920-01-21 DOA: 05/28/2017 PCP: Merlene Laughter, MD   Brief Narrative: Sara Clark is a 83 y.o. female with a history of hypertension, CAD status post stent placement, macular degeneration, urinary incontinence. She presented with mental status change and was found to have a right lower lobe pneumonia likely secondary to aspiration. She also has a urinary tract infection from a previous encounter.  Assessment & Plan:   Principal Problem:   UTI (urinary tract infection) Active Problems:   Hypertension   CAP (community acquired pneumonia)   Acute metabolic encephalopathy   Sepsis (HCC)   CAD (coronary artery disease)   Abdominal pain   Aspiration pneumonia (HCC)   Pneumonia of right lower lobe due to infectious organism (HCC)   E. Coli Urinary tract infection -Culture results demonstrated pansensitive Escherichia coli  -We will continue treatment with Rocephin  -Patient denies dysuria.   Community acquired pneumonia/aspiration pneumonia and pneumonitis. -Chest x-ray with right lower lobe infiltrate  -Patient now afebrile, with improvement in her WBCs, no tachypnea and improvement in her respiratory status.  -Will continue treatment with Rocephin; will also continue pulmonary hygiene, flutter valve, incentive spirometer and Mucinex.  -CT abdomen suggested esophageal dysmotility; patient with MBS that demonstrated no oropharyngeal component on dysphagia.  -Will continue PPI.  -Continue DuoNeb and Pulmicort  -Appreciate assistance and recommendations by pulmonary services.  Lingular nodule - nodule appreciated on admission -per radiology recommendations, patient will benefit of repeat non contrast CT chest in 12 months.   CAD -Patient denies chest pain, there is no acute ischemic changes on her EKG or telemetry.  -Will continue aspirin and beta blocker. 6  Essential hypertension -Blood pressure is  well-controlled -Will continue atenolol and amlodipine  Abdominal pain -Patient with a CT abdomen demonstrated cholelithiasis without acute cholecystitis.  -Denies nausea, abdominal pain and vomiting. -Will monitor.  Physical deconditioning -The patient has been seen by physical therapy/occupational therapy and recommendations are for skilled nursing facility for further rehabilitation and care prior to returning home. -Family and patient in agreement; social worker consulted and facility picked for anticipated discharge on 06/03/2017 if the patient remains stable.  DVT prophylaxis: Lovenox Code Status: DNR/DNI Family Communication: no family at bedside. Disposition Plan: Patient will be discharged to a skilled nursing facility for further care and rehabilitation on 06/03/2017 unless otherwise further complications appreciated.   Consultants:   Pulmonology  Procedures:   None  Antimicrobials:  Ceftriaxone 5/29  Doxycycline and Flagyl 5/29>>5/31   Subjective: Denies chest pain, endorses significant improvement in her breathing. Patient is afebrile. Oxygen saturation on room air 90-93%.  Objective: Vitals:   06/01/17 0930 06/01/17 1131 06/01/17 1219 06/01/17 1433  BP:    134/71  Pulse:    84  Resp:    18  Temp:    98.5 F (36.9 C)  TempSrc:    Oral  SpO2: 92% 90% 92% 93%  Weight:      Height:        Intake/Output Summary (Last 24 hours) at 06/01/17 1857 Last data filed at 06/01/17 1300  Gross per 24 hour  Intake              330 ml  Output              501 ml  Net             -171 ml   Filed Weights   05/29/17 0236  Weight: 60.9 kg (134  lb 4.2 oz)    Examination: General exam: Patient feeling much better, denies chest pain, no nausea, no vomiting and sprays significant improvement in her breathing. Patient is currently off oxygen and with good oxygen saturation on room air at rest. Patient endorses some improvement in her appetite. She is alert, awake and  oriented 3. Respiratory system: Good air movement bilaterally, scattered rhonchi, no wheezing, no crackles. Cardiovascular system: RRR, no rubs, no gallops, no murmurs.   Gastrointestinal system: Soft, nontender, nondistended, positive bowel sounds.  Central nervous system: No focal deficit appreciated. Cranial nerve intact. Patient able to follow commands properly and moving 4 limbs. Extremities: No edema, no cyanosis or clubbing. Skin: No petechia, no open wounds. Psychiatry: Appropriate mood. No depression, no anxiety or agitation. Good insight demonstrated.   Data Reviewed: I have personally reviewed following labs and imaging studies  CBC:  Recent Labs Lab 05/27/17 2134 05/28/17 2235 05/30/17 1032 05/31/17 0605  WBC 11.1* 14.3* 14.1* 13.8*  NEUTROABS 9.0* 11.9*  --  11.4*  HGB 14.3 13.0 11.5* 12.3  HCT 41.2 37.2 32.9* 34.5*  MCV 92.4 90.5 89.9 88.0  PLT 180 181 208 247   Basic Metabolic Panel:  Recent Labs Lab 05/27/17 2134 05/28/17 2236 05/30/17 1032 05/31/17 0605  NA 131* 131* 130* 130*  K 4.3 4.1 3.6 3.5  CL 98* 100* 102 101  CO2 24 24 19* 20*  GLUCOSE 141* 122* 162* 108*  BUN 18 13 10 10   CREATININE 0.77 0.73 0.70 0.56  CALCIUM 10.0 9.5 8.4* 8.8*  MG  --   --   --  1.7  PHOS  --   --   --  1.8*   GFR: Estimated Creatinine Clearance: 36.9 mL/min (by C-G formula based on SCr of 0.56 mg/dL).   Liver Function Tests:  Recent Labs Lab 05/27/17 2134 05/28/17 2236  AST 27 28  ALT 17 20  ALKPHOS 82 80  BILITOT 0.5 0.5  PROT 7.3 6.7  ALBUMIN 3.9 3.3*    Recent Labs Lab 05/29/17 0631  LIPASE 25   Sepsis Labs:  Recent Labs Lab 05/27/17 2242 05/28/17 2332 05/29/17 0204  PROCALCITON  --   --  <0.10  LATICACIDVEN 0.75 0.94 0.9    Recent Results (from the past 240 hour(s))  Urine culture     Status: Abnormal   Collection Time: 05/27/17 10:00 PM  Result Value Ref Range Status   Specimen Description URINE, RANDOM  Final   Special Requests  NONE  Final   Culture >=100,000 COLONIES/mL ESCHERICHIA COLI (A)  Final   Report Status 05/30/2017 FINAL  Final   Organism ID, Bacteria ESCHERICHIA COLI (A)  Final      Susceptibility   Escherichia coli - MIC*    AMPICILLIN 4 SENSITIVE Sensitive     CEFAZOLIN <=4 SENSITIVE Sensitive     CEFTRIAXONE <=1 SENSITIVE Sensitive     CIPROFLOXACIN >=4 RESISTANT Resistant     GENTAMICIN 4 SENSITIVE Sensitive     IMIPENEM 0.5 SENSITIVE Sensitive     NITROFURANTOIN <=16 SENSITIVE Sensitive     TRIMETH/SULFA <=20 SENSITIVE Sensitive     AMPICILLIN/SULBACTAM <=2 SENSITIVE Sensitive     PIP/TAZO <=4 SENSITIVE Sensitive     Extended ESBL NEGATIVE Sensitive     * >=100,000 COLONIES/mL ESCHERICHIA COLI  Blood culture (routine x 2)     Status: None (Preliminary result)   Collection Time: 05/28/17 10:40 PM  Result Value Ref Range Status   Specimen Description BLOOD LEFT ANTECUBITAL  Final   Special Requests   Final    BOTTLES DRAWN AEROBIC AND ANAEROBIC Blood Culture adequate volume   Culture   Final    NO GROWTH 3 DAYS Performed at [facility], [address], [place], [place] [address]    Report Status PENDING  Incomplete  Blood culture (routine x 2)     Status: None (Preliminary result)   Collection Time: 05/28/17 10:45 PM  Result Value Ref Range Status   Specimen Description BLOOD RIGHT ANTECUBITAL  Final   Special Requests   Final    BOTTLES DRAWN AEROBIC AND ANAEROBIC Blood Culture adequate volume   Culture   Final    NO GROWTH 3 DAYS Performed at [facility], [address] [place], [place] [address]    Report Status PENDING  Incomplete  MRSA PCR Screening     Status: None   Collection Time: 05/29/17  3:50 AM  Result Value Ref Range Status   MRSA by PCR NEGATIVE NEGATIVE Final    Comment:        The GeneXpert MRSA Assay (FDA approved for NASAL specimens only), is one component of a comprehensive MRSA colonization surveillance program. It is not intended to  diagnose MRSA infection nor to guide or monitor treatment for MRSA infections.   Culture, expectorated sputum-assessment     Status: None   Collection Time: 05/31/17 10:07 AM  Result Value Ref Range Status   Specimen Description SPUTUM  Final   Special Requests Normal  Final   Sputum evaluation THIS SPECIMEN IS ACCEPTABLE FOR SPUTUM CULTURE  Final   Report Status 05/31/2017 FINAL  Final  Culture, respiratory (NON-Expectorated)     Status: None (Preliminary result)   Collection Time: 05/31/17 10:07 AM  Result Value Ref Range Status   Specimen Description SPUTUM  Final   Special Requests Normal Reflexed from [id number]  Final   Gram Stain   Final    MODERATE WBC PRESENT,BOTH PMN AND MONONUCLEAR NO ORGANISMS SEEN    Culture   Final    CULTURE REINCUBATED FOR BETTER GROWTH Performed at [facility], [address], [place], [place] [address]    Report Status PENDING  Incomplete      Radiology Studies: Dg Chest 2 View  Result Date: 05/31/2017 CLINICAL DATA:  83 year old female with history productive cough. EXAM: CHEST  2 VIEW COMPARISON:  Chest x-ray 05/30/2017. FINDINGS: Ill-defined opacities in the lower lobes of the lungs bilaterally (right greater than left). Probable trace right pleural effusion. No significant left pleural effusion. No evidence of pulmonary edema. Heart size is normal. Upper mediastinal contours are within normal limits. Atherosclerosis in the thoracic aorta. Coronary artery stents are noted. IMPRESSION: 1. Bibasilar opacities (right greater than left) very similar to prior study from 05/30/2017. On the right, this is compatible with pneumonia which appears to involve the entire right lower lobe at this time. On the left, some component of atelectasis is likely present, however, additional airspace disease is also suspected. 2. Trace right pleural effusion. 3. Aortic atherosclerosis. Electronically Signed   By: Trudie Reed M.D.   On: 05/31/2017 09:45   Dg  Swallowing Func-speech Pathology  Result Date: 05/31/2017 Objective Swallowing Evaluation: Type of Study: MBS-Modified Barium Swallow Study Patient Details Name: Sara Clark MRN: [id number] Date of Birth: Jan 06, 1920 Today's Date: 05/31/2017 Time: SLP Start Time (ACUTE ONLY): 1150-SLP Stop Time (ACUTE ONLY): 1225 SLP Time Calculation (min) (ACUTE ONLY): 35 min Past Medical History: Past Medical History:  Diagnosis Date . Hypertension  . Macular degeneration  . Myocardial infarct (HCC)  . Urinary incontinence  . Venous stasis  Past Surgical History: Past Surgical History: Procedure Laterality Date . CORONARY ANGIOPLASTY WITH STENT PLACEMENT   HPI: Ptis a 83 y.o.femalewith medical history significant for hypertension, CAD, stent placement, macular degeneration, and urinary incontinence, who presents with altered mental status, cough and abdominal pain- diagnosed with UTI, pna and encephalopathy.   Grandson reports concern for pt having some baseline dysphagia as he has witnessed her coughing during meals on occasion.  In addition, pt admits to pill dysphagia, stating they lodge in throat and require her to bring them back up into mouth and swallow again.  Pt pointed near right vallecular region when articulating re: pill stasis.  She denies weight loss, admits to requiring heimlich manuever x1 approximately 20 years ago due to choking on steak.  Pt denies problems swallowing foods or liquids but admits to some reflux symptoms.  CT also showed right lower lobe airspace disease most consistent with either infection or aspiration. Per Abdomen CT, pt also with "Small hiatal hernia with distal esophageal contrast level suggests dysmotility or gastroesophageal reflux and could predispose the patient to aspiration."  Clinical swallow evaluation completed with concerns for primary esophageal symptoms/signs - MBS ordered to rule out oropharyngeal deficit.   Subjective: pt alert Assessment / Plan / Recommendation CHL IP  CLINICAL IMPRESSIONS 05/31/2017 Clinical Impression Pt with only minimal oropharyngeal dysphagia without aspiration/penetration of any consistency tested.  She did have trace pharyngeal residuals most notably with liquids. Barium tablet taken with thin lodged at vallecular space - requiring 2 more thin liquid boluses to transit into esophagus.  Pt did not report overt sensation to barium tablet stasis but volitionally drank extra liquid to clear, therefore suspect some sensation present.  Of note, pt does report premorbid occasional difficulties with sensing pills lodging pointing near right vallecular space.  Recommend advance diet to maximize pt intake.  Pt states she needs foods that have liquids like pudding/applesauce - suspect moist foods desired and dry crumbly foods should be avoided.  Using video monitor, educated pt to findings/recommendations.    SLP Visit Diagnosis Dysphagia, unspecified (R13.10) Attention and concentration deficit following -- Frontal lobe and executive function deficit following -- Impact on safety and function Mild aspiration risk;Moderate aspiration risk   CHL IP TREATMENT RECOMMENDATION 05/31/2017 Treatment Recommendations Therapy as outlined in treatment plan below   Prognosis 05/31/2017 Prognosis for Safe Diet Advancement Good Barriers to Reach Goals -- Barriers/Prognosis Comment -- CHL IP DIET RECOMMENDATION 05/31/2017 SLP Diet Recommendations Dysphagia 3 (Mech soft) solids;Thin liquid, extra gravies/sauces Liquid Administration via Cup;Straw Medication Administration Whole meds with puree- start and follow with liquids Compensations Slow rate;Small sips/bites;Follow solids with liquid, start meals with liquids Postural Changes Remain semi-upright after after feeds/meals (Comment);Seated upright at 90 degrees   No flowsheet data found.  CHL IP FOLLOW UP RECOMMENDATIONS 05/31/2017 Follow up Recommendations None   CHL IP FREQUENCY AND DURATION 05/31/2017 Speech Therapy Frequency (ACUTE ONLY)  min 2x/week Treatment Duration 2 weeks      CHL IP ORAL PHASE 05/31/2017 Oral Phase Impaired Oral - Pudding Teaspoon -- Oral - Pudding Cup -- Oral - Honey Teaspoon -- Oral - Honey Cup -- Oral - Nectar Teaspoon -- Oral - Nectar Cup WFL Oral - Nectar Straw -- Oral - Thin Teaspoon WFL Oral - Thin Cup WFL Oral - Thin Straw WFL Oral - Puree WFL;Piecemeal swallowing;Premature spillage Oral - Mech Soft --  Oral - Regular WFL;Premature spillage Oral - Multi-Consistency -- Oral - Pill Reduced posterior propulsion Oral Phase - Comment --  CHL IP PHARYNGEAL PHASE 05/31/2017 Pharyngeal Phase Impaired Pharyngeal- Pudding Teaspoon -- Pharyngeal -- Pharyngeal- Pudding Cup -- Pharyngeal -- Pharyngeal- Honey Teaspoon -- Pharyngeal -- Pharyngeal- Honey Cup -- Pharyngeal -- Pharyngeal- Nectar Teaspoon -- Pharyngeal -- Pharyngeal- Nectar Cup WFL Pharyngeal -- Pharyngeal- Nectar Straw -- Pharyngeal -- Pharyngeal- Thin Teaspoon WFL;Pharyngeal residue - valleculae;Pharyngeal residue - pyriform Pharyngeal -- Pharyngeal- Thin Cup [facility];Pharyngeal residue - valleculae;Pharyngeal residue - pyriform Pharyngeal -- Pharyngeal- Thin Straw WFL;Pharyngeal residue - valleculae;Pharyngeal residue - pyriform Pharyngeal -- Pharyngeal- Puree WFL Pharyngeal -- Pharyngeal- Mechanical Soft -- Pharyngeal -- Pharyngeal- Regular WFL Pharyngeal -- Pharyngeal- Multi-consistency -- Pharyngeal -- Pharyngeal- Pill Reduced tongue base retraction;Pharyngeal residue - valleculae Pharyngeal -- Pharyngeal Comment pt did not admit to sensation of pill lodging in vallecular space but independently consumed 2 extra liquid boluses to transit into esophagus  CHL IP CERVICAL ESOPHAGEAL PHASE 05/31/2017 No flowsheet data found. Donavan Burnet, MS [facility] SLP [phone]                Scheduled Meds: . amLODipine  5 mg Oral Daily  . aspirin EC  81 mg Oral Q3 days  . atenolol  25 mg Oral BID  . budesonide (PULMICORT) nebulizer solution  0.5 mg Nebulization BID  .  dextromethorphan-guaiFENesin  1 tablet Oral BID  . enoxaparin (LOVENOX) injection  40 mg Subcutaneous Q24H  . feeding supplement (ENSURE ENLIVE)  237 mL Oral BID BM  . ipratropium-albuterol  3 mL Nebulization QID  . multivitamin  1 tablet Oral Daily  . multivitamin with minerals  1 tablet Oral Daily  . oxybutynin  5 mg Oral BID  . senna  1 tablet Oral BID   Continuous Infusions: . cefTRIAXone (ROCEPHIN)  IV Stopped (05/31/17 2158)     LOS: 3 days     Vassie Loll, MD [facility] 06/01/2017, 6:57 PM Pager: [phone]  If 7PM-7AM, please contact night-coverage [email or web address] Password [facility] 06/01/2017, 6:57 PM

## 2017-06-01 NOTE — Clinical Social Work Note (Signed)
Per Dr. Gwenlyn PerkingMadera- anticipates stability for d/c on Monday 06/03/17.  Bed offers currently in place and discussed with patient. She did not have a preference and deferred to her grandson Minerva Areolaric. CSW spoke to BallplayEric and he stated he has toured [facility] as it is very clean, looks nice and is about 10 minutes from his home.  He was notified of plan to d/c on Monday if medically stable.  Weekday CSW will finalize. Attempted to leave  Message left [facility] v[facility]mail [facility] but unable to connect to v-mail.  Discussed with patient's nurse Irving BurtonEmily as well.  Lorri Frederickonna T. Jaci LazierCrowder, [place] [phone]

## 2017-06-01 NOTE — Clinical Social Work Note (Signed)
Per MD note- stability anticipated in 48-72 hours.  [facility] No needs today CSW services will coordinate d/c when medically stable.  Lorri Frederickonna T. Jaci LazierCrowder, [place] [phone]

## 2017-06-01 NOTE — Progress Notes (Signed)
PT finishing lunch and does not wish to use chest vest at this time. PT can utilize Flutter- demonstrates hands on understanding of device.

## 2017-06-01 NOTE — Progress Notes (Signed)
Family concerned about timing of meal trays arriving. Pt set up w/ room service assist, meals placed for today with set times. Nursing will work to ensure that meals are ordered and delivered timely.  Justin Mendaudle, Sukari Grist H, RN

## 2017-06-02 DIAGNOSIS — R0602 Shortness of breath: Secondary | ICD-10-CM

## 2017-06-02 DIAGNOSIS — B9620 Unspecified Escherichia coli [E. coli] as the cause of diseases classified elsewhere: Secondary | ICD-10-CM

## 2017-06-02 DIAGNOSIS — N390 Urinary tract infection, site not specified: Secondary | ICD-10-CM

## 2017-06-02 LAB — CBC
HCT: 34.7 % — ABNORMAL LOW (ref 36.0–46.0)
Hemoglobin: 12 g/dL (ref 12.0–15.0)
MCH: 30.8 pg (ref 26.0–34.0)
MCHC: 34.6 g/dL (ref 30.0–36.0)
MCV: 89 fL (ref 78.0–100.0)
PLATELETS: 332 10*3/uL (ref 150–400)
RBC: 3.9 MIL/uL (ref 3.87–5.11)
RDW: 13.7 % (ref 11.5–15.5)
WBC: 11.6 10*3/uL — ABNORMAL HIGH (ref 4.0–10.5)

## 2017-06-02 LAB — BASIC METABOLIC PANEL
Anion gap: 5 (ref 5–15)
BUN: 12 mg/dL (ref 6–20)
CHLORIDE: 103 mmol/L (ref 101–111)
CO2: 25 mmol/L (ref 22–32)
CREATININE: 0.61 mg/dL (ref 0.44–1.00)
Calcium: 9.1 mg/dL (ref 8.9–10.3)
GFR calc Af Amer: >60 mL/min (ref >60)
GFR calc non Af Amer: >60 mL/min (ref >60)
GLUCOSE: 100 mg/dL — AB (ref 65–99)
Potassium: 3.6 mmol/L (ref 3.5–5.1)
SODIUM: 133 mmol/L — AB (ref 135–145)

## 2017-06-02 MED ORDER — CEFUROXIME AXETIL 250 MG PO TABS
250.0000 mg | ORAL_TABLET | Freq: Two times a day (BID) | ORAL | Status: DC
  Administered 2017-06-02 – 2017-06-03 (×3): 250 mg via ORAL
  Filled 2017-06-02 (×3): qty 1

## 2017-06-02 NOTE — Progress Notes (Signed)
PROGRESS NOTE    Yolinda Duerr  GMW:[id number] DOB: April 28, 1920 DOA: 05/28/2017 PCP: Merlene Laughter, MD   Brief Narrative: Sara Clark is a 83 y.o. female with a history of hypertension, CAD status post stent placement, macular degeneration, urinary incontinence. She presented with mental status change and was found to have a right lower lobe pneumonia likely secondary to aspiration. She also has a urinary tract infection from a previous encounter.  Assessment & Plan:   Principal Problem:   UTI (urinary tract infection) Active Problems:   Hypertension   CAP (community acquired pneumonia)   Acute metabolic encephalopathy   Sepsis (HCC)   CAD (coronary artery disease)   Abdominal pain   Aspiration pneumonia (HCC)   Pneumonia of right lower lobe due to infectious organism (HCC)   E. Coli Urinary tract infection -Culture results demonstrated pansensitive Escherichia coli  -Patient denies dysuria.  -Will transition antibiotics to oral regimen, using Ceftin. Plan is to complete a total 10 days (patient on day 5 out of 10).  Community acquired pneumonia/aspiration pneumonia and pneumonitis. -Chest x-ray with right lower lobe infiltrate; will repeat in a.m. in order to follow up stability and improvement/resolution after being on antibiotics. -Patient now afebrile, with continue improvement in her WBCs, no tachypnea and significant improvement in her respiratory status. Currently with good oxygen saturation on room air.  -She will continue treatment with antibiotics, but is going to be transition to Ceftin orally ; will continue Mucinex, the use of IS and flutter valve. -CT abdomen suggested esophageal dysmotility; patient with MBS that demonstrated no oropharyngeal component on dysphagia. Continue dysphagia 3 and thin liquids.  -Will continue PPI.  -Continue DuoNeb and Pulmicort  -Appreciate assistance and recommendations by pulmonary services.  Lingular nodule - nodule  appreciated on admission -per radiology recommendations, patient will benefit of repeat non contrast CT chest in 12 months.   CAD -Overall is stable. -Troponin negative  -Patient denies chest pain, no acute ischemic changes appreciated on telemetry or EKG.  -Will discontinue telemetry.  -continue ASA and b-blocker.   Essential hypertension -Blood pressure stable and well controlled. -Will continue the use of atenolol and amlodipine.  Abdominal pain -Patient with a CT abdomen that demonstrated cholelithiasis without acute cholecystitis.  -Patient without abdominal pain, nausea, vomiting or any other acute complaints.  -We will continue monitoring.  -Abdominal pain most likely associated with pneumonia/UTI.  Physical deconditioning -The patient has been seen by physical therapy/occupational therapy and recommendations are for skilled nursing facility for further rehabilitation and care prior to returning home. -Family and patient in agreement; social worker consulted and facility picked for anticipated discharge on 06/03/2017 if the patient remains stable.  DVT prophylaxis: Lovenox Code Status: DNR/DNI Family Communication: grandson Updated over the Phone. Disposition Plan: Patient will be discharged to a skilled nursing facility for further care and rehabilitation on 06/03/2017 unless otherwise further complications appreciated. Will transition antibiotics to oral regimen and will repeat chest x-ray in the morning.   Consultants:   Pulmonology  Procedures:   None  Antimicrobials:  Ceftriaxone 5/29>>>6/3  Doxycycline and Flagyl 5/29>>5/31  ceftin 6/3   Subjective: Feeling much better. Maintaining good oxygen saturation on room air. Denies chest pain or shortness of breath. Patient endorses no dysuria  Objective: Vitals:   06/02/17 0512 06/02/17 0615 06/02/17 0753 06/02/17 1400  BP: (!) 142/52 130/62  123/78  Pulse: 75   74  Resp:    18  Temp: 98.7 F (37.1 C)    98 F (36.7  C)  TempSrc: Oral   Oral  SpO2: 96%  95% 94%  Weight:      Height:        Intake/Output Summary (Last 24 hours) at 06/02/17 1628 Last data filed at 06/02/17 0900  Gross per 24 hour  Intake              620 ml  Output                0 ml  Net              620 ml   Filed Weights   05/29/17 0236  Weight: 60.9 kg (134 lb 4.2 oz)    Examination: General exam: Patient is feeling much better, denies chest pain, no nausea or vomiting. Oriented 3, able to follow commands and currently with good oxygen saturation on room air. Respiratory system: No wheezing, good air movement bilaterally; no crackles positive for scattered rhonchi (right more than left).  Cardiovascular system: No rubs, no gallops, no murmurs. S1 and S2 appreciated on exam.    Gastrointestinal system: Soft, nontender, nondistended, positive bowel sounds.  Central nervous system: Cranial nerves intact, moving 4 limbs and no focal deficits appreciated. Extremities: No edema, no cyanosis, no clubbing. Skin: No open wounds, some chronic venous stasis dermatitis on her lower extremities. Psychiatry: Alert, awake and oriented 3, stable, no hallucinations, patient following commands appropriately.   Data Reviewed: I have personally reviewed following labs and imaging studies  CBC:  Recent Labs Lab 05/27/17 2134 05/28/17 2235 05/30/17 1032 05/31/17 0605 06/02/17 0611  WBC 11.1* 14.3* 14.1* 13.8* 11.6*  NEUTROABS 9.0* 11.9*  --  11.4*  --   HGB 14.3 13.0 11.5* 12.3 12.0  HCT 41.2 37.2 32.9* 34.5* 34.7*  MCV 92.4 90.5 89.9 88.0 89.0  PLT 180 181 208 247 332   Basic Metabolic Panel:  Recent Labs Lab 05/27/17 2134 05/28/17 2236 05/30/17 1032 05/31/17 0605 06/02/17 0611  NA 131* 131* 130* 130* 133*  K 4.3 4.1 3.6 3.5 3.6  CL 98* 100* 102 101 103  CO2 24 24 19* 20* 25  GLUCOSE 141* 122* 162* 108* 100*  BUN 18 13 10 10 12   CREATININE 0.77 0.73 0.70 0.56 0.61  CALCIUM 10.0 9.5 8.4* 8.8* 9.1  MG   --   --   --  1.7  --   PHOS  --   --   --  1.8*  --    GFR: Estimated Creatinine Clearance: 36.9 mL/min (by C-G formula based on SCr of 0.61 mg/dL).   Liver Function Tests:  Recent Labs Lab 05/27/17 2134 05/28/17 2236  AST 27 28  ALT 17 20  ALKPHOS 82 80  BILITOT 0.5 0.5  PROT 7.3 6.7  ALBUMIN 3.9 3.3*    Recent Labs Lab 05/29/17 0631  LIPASE 25   Sepsis Labs:  Recent Labs Lab 05/27/17 2242 05/28/17 2332 05/29/17 0204  PROCALCITON  --   --  <0.10  LATICACIDVEN 0.75 0.94 0.9    Recent Results (from the past 240 hour(s))  Urine culture     Status: Abnormal   Collection Time: 05/27/17 10:00 PM  Result Value Ref Range Status   Specimen Description URINE, RANDOM  Final   Special Requests NONE  Final   Culture >=100,000 COLONIES/mL ESCHERICHIA COLI (A)  Final   Report Status 05/30/2017 FINAL  Final   Organism ID, Bacteria ESCHERICHIA COLI (A)  Final      Susceptibility   Escherichia coli -  MIC*    AMPICILLIN 4 SENSITIVE Sensitive     CEFAZOLIN <=4 SENSITIVE Sensitive     CEFTRIAXONE <=1 SENSITIVE Sensitive     CIPROFLOXACIN >=4 RESISTANT Resistant     GENTAMICIN 4 SENSITIVE Sensitive     IMIPENEM 0.5 SENSITIVE Sensitive     NITROFURANTOIN <=16 SENSITIVE Sensitive     TRIMETH/SULFA <=20 SENSITIVE Sensitive     AMPICILLIN/SULBACTAM <=2 SENSITIVE Sensitive     PIP/TAZO <=4 SENSITIVE Sensitive     Extended ESBL NEGATIVE Sensitive     * >=100,000 COLONIES/mL ESCHERICHIA COLI  Blood culture (routine x 2)     Status: None (Preliminary result)   Collection Time: 05/28/17 10:40 PM  Result Value Ref Range Status   Specimen Description BLOOD LEFT ANTECUBITAL  Final   Special Requests   Final    BOTTLES DRAWN AEROBIC AND ANAEROBIC Blood Culture adequate volume   Culture   Final    NO GROWTH 4 DAYS Performed at [facility], [address] [place], [place] [address]    Report Status PENDING  Incomplete  Blood culture (routine x 2)     Status: None  (Preliminary result)   Collection Time: 05/28/17 10:45 PM  Result Value Ref Range Status   Specimen Description BLOOD RIGHT ANTECUBITAL  Final   Special Requests   Final    BOTTLES DRAWN AEROBIC AND ANAEROBIC Blood Culture adequate volume   Culture   Final    NO GROWTH 4 DAYS Performed at [facility], [address] [place], [place] [address]    Report Status PENDING  Incomplete  MRSA PCR Screening     Status: None   Collection Time: 05/29/17  3:50 AM  Result Value Ref Range Status   MRSA by PCR NEGATIVE NEGATIVE Final    Comment:        The GeneXpert MRSA Assay (FDA approved for NASAL specimens only), is one component of a comprehensive MRSA colonization surveillance program. It is not intended to diagnose MRSA infection nor to guide or monitor treatment for MRSA infections.   Culture, expectorated sputum-assessment     Status: None   Collection Time: 05/31/17 10:07 AM  Result Value Ref Range Status   Specimen Description SPUTUM  Final   Special Requests Normal  Final   Sputum evaluation THIS SPECIMEN IS ACCEPTABLE FOR SPUTUM CULTURE  Final   Report Status 05/31/2017 FINAL  Final  Culture, respiratory (NON-Expectorated)     Status: None (Preliminary result)   Collection Time: 05/31/17 10:07 AM  Result Value Ref Range Status   Specimen Description SPUTUM  Final   Special Requests Normal Reflexed from [id number]  Final   Gram Stain   Final    MODERATE WBC PRESENT,BOTH PMN AND MONONUCLEAR NO ORGANISMS SEEN    Culture   Final    CULTURE REINCUBATED FOR BETTER GROWTH Performed at [facility], [address] [place], [place] [address]    Report Status PENDING  Incomplete      Radiology Studies: No results found.   Scheduled Meds: . amLODipine  5 mg Oral Daily  . aspirin EC  81 mg Oral Q3 days  . atenolol  25 mg Oral BID  . budesonide (PULMICORT) nebulizer solution  0.5 mg Nebulization BID  . cefUROXime  250 mg Oral BID WC  .  dextromethorphan-guaiFENesin  1 tablet Oral BID  . enoxaparin (LOVENOX) injection  40 mg Subcutaneous Q24H  . feeding supplement (ENSURE ENLIVE)  237 mL Oral  BID BM  . ipratropium-albuterol  3 mL Nebulization QID  . multivitamin  1 tablet Oral Daily  . multivitamin with minerals  1 tablet Oral Daily  . oxybutynin  5 mg Oral BID  . pantoprazole  20 mg Oral Q1200  . senna  1 tablet Oral BID   Continuous Infusions:    LOS: 4 days     Vassie Loll, MD [facility] 06/02/2017, 4:28 PM Pager: [phone]  If 7PM-7AM, please contact night-coverage [email or web address] Password [facility] 06/02/2017, 4:28 PM

## 2017-06-03 ENCOUNTER — Inpatient Hospital Stay (HOSPITAL_COMMUNITY): Payer: Medicare Other

## 2017-06-03 DIAGNOSIS — G9349 Other encephalopathy: Secondary | ICD-10-CM

## 2017-06-03 DIAGNOSIS — B999 Unspecified infectious disease: Secondary | ICD-10-CM

## 2017-06-03 DIAGNOSIS — R0602 Shortness of breath: Secondary | ICD-10-CM

## 2017-06-03 DIAGNOSIS — R5381 Other malaise: Secondary | ICD-10-CM

## 2017-06-03 DIAGNOSIS — K219 Gastro-esophageal reflux disease without esophagitis: Secondary | ICD-10-CM

## 2017-06-03 LAB — CULTURE, BLOOD (ROUTINE X 2)
CULTURE: NO GROWTH
Culture: NO GROWTH
SPECIAL REQUESTS: ADEQUATE
Special Requests: ADEQUATE

## 2017-06-03 MED ORDER — CEFUROXIME AXETIL 250 MG PO TABS: 250.0000 mg | ORAL_TABLET | Freq: Two times a day (BID) | ORAL | Status: AC

## 2017-06-03 MED ORDER — PANTOPRAZOLE SODIUM 20 MG PO TBEC: 20.0000 mg | DELAYED_RELEASE_TABLET | Freq: Every day | ORAL | Status: DC

## 2017-06-03 MED ORDER — IPRATROPIUM-ALBUTEROL 20-100 MCG/ACT IN AERS: 1.0000 | INHALATION_SPRAY | Freq: Four times a day (QID) | RESPIRATORY_TRACT | Status: DC | PRN

## 2017-06-03 MED ORDER — ENSURE ENLIVE PO LIQD: 237.0000 mL | Freq: Two times a day (BID) | ORAL | Status: DC

## 2017-06-03 NOTE — Clinical Social Work Placement (Addendum)
PTAR scheduled for 5:00 pick. Earney MalletGrandson-Dan will go fillout paperwork when patient discharges. RN given number for report.   CLINICAL SOCIAL WORK PLACEMENT  NOTE  Date:  06/03/2017  Patient Details  Name: Sara OaksLillie Rooke MRN: [id number] Date of Birth: 03-05-1920  Clinical Social Work is seeking post-discharge placement for this patient at the Skilled  Nursing Facility level of care (*CSW will initial, date and re-position this form in  chart as items are completed):  Yes   Patient/family provided with [facility] Clinical Social Work Department's list of facilities offering this level of care within the geographic area requested by the patient (or if unable, by the patient's family).  Yes   Patient/family informed of their freedom to choose among providers that offer the needed level of care, that participate in Medicare, Medicaid or managed care program needed by the patient, have an available bed and are willing to accept the patient.  Yes   Patient/family informed of [facility]'s ownership interest in [facility] and [facility], as well as of the fact that they are under no obligation to receive care at these facilities.  PASRR submitted to EDS on 05/31/17     PASRR number received on 05/31/17     Existing PASRR number confirmed on       FL2 transmitted to all facilities in geographic area requested by pt/family on       FL2 transmitted to all facilities within larger geographic area on       Patient informed that his/her managed care company has contracts with or will negotiate with certain facilities, including the following:  [facility]     Yes   Patient/family informed of bed offers received.  [facility]     Physician recommends and patient chooses bed at      [facility] on  . 06/03/2017  Patient to be transferred to facility by PTAR     Patient family notified on   of transfer.  06/03/2017  Name of family member notified:      Patrina Leveringan Casler  PHYSICIAN       Additional Comment:    _______________________________________________ Clearance CootsNicole A Rekha Hobbins, LCSW 06/03/2017, 12:38 PM

## 2017-06-03 NOTE — Care Management Important Message (Signed)
Important Message  Patient Details  Name: Sara Clark MRN: [id number] Date of Birth: [birth year]   Medicare Important Message Given:  Yes    [name], [name] 06/03/2017, 11:39 AMImportant Message  Patient Details  Name: Sara Clark MRN: [id number] Date of Birth: [birth year]   Medicare Important Message Given:  Yes    [name], [name] 06/03/2017, 11:39 AM

## 2017-06-03 NOTE — Discharge Summary (Signed)
Physician Discharge Summary  Sara Clark ZOX:[id number] DOB: [birth year] DOA: 05/28/2017  PCP: Merlene Laughter, MD  Admit date: 05/28/2017 Discharge date: 06/03/2017  Time spent: 35 minutes  Recommendations for Outpatient Follow-up:  1. Repeat basic metabolic panel in about 5 days to follow electrolytes and renal function 2. 5 more days of antibiotics at discharge 3. Use incentive spirometry and flutter valve as instructed  4. Repeat CXR in 2-3 weeks to assure resolution/stability of pulmonary infiltrates.   Discharge Diagnoses:  Principal Problem:   UTI (urinary tract infection) Active Problems:   Hypertension   CAP (community acquired pneumonia)   Acute metabolic encephalopathy   Sepsis (HCC)   CAD (coronary artery disease)   Abdominal pain   Aspiration pneumonia (HCC)   Pneumonia of right lower lobe due to infectious organism (HCC)   Encephalopathy due to infection   SOB (shortness of breath)   Physical deconditioning   Gastroesophageal reflux disease   Discharge Condition:  A stable and improved. Patient has been discharged to skilled nursing facility for further rehabilitation and care.  Diet recommendation: Dysphagia 3 diet with thin liquids. Heart healthy  Filed Weights   05/29/17 0236  Weight: 60.9 kg (134 lb 4.2 oz)    Brief History of present illness:  Sara Clark is a 83 y.o. female with a history of hypertension, CAD status post stent placement, macular degeneration, urinary incontinence. She presented with mental status change and was found to have a right lower lobe pneumonia likely secondary to aspiration. She also has a urinary tract infection from a previous encounter.  Hospital Course:  E. Coli Urinary tract infection -Culture results demonstrated pansensitive Escherichia coli  -Patient denies dysuria.  -Will transition antibiotics to oral regimen, using Ceftin. Plan is to complete a total 10 days (patient with 5 days pending at  discharge).  Community acquired pneumonia/aspiration pneumonia and pneumonitis. -Chest x-ray with right lower lobe infiltrate; will repeat in a.m. in order to follow up stability and improvement/resolution after being on antibiotics. -Patient now afebrile, with continue improvement in her WBCs, no tachypnea and significant improvement in her respiratory status. Currently with good oxygen saturation on room air.  -She will continue treatment with antibiotics, but is going to be transition to Ceftin orally ; will continue Mucinex and the use of IS and flutter valve. -CT abdomen suggested esophageal dysmotility; patient with MBS that demonstrated no oropharyngeal component on dysphagia.  -Will continue PPI and as per Speech therapy recommendations, a dysphagia 3 diet with thin liquids. -Continue when necessary combivent  -Appreciate assistance and recommendations by pulmonary services.  Lingular nodule - nodule appreciated on admission -per radiology recommendations, patient will benefit of repeat non contrast CT chest in 12 months.   CAD -Overall is stable. -Troponin negative  -Patient denies chest pain, no acute ischemic changes appreciated on telemetry or EKG.  -Will continue heart healthy diet -continue ASA and b-blocker.   Essential hypertension -Blood pressure stable and well controlled. -Will continue the use of atenolol and amlodipine. -Continue Heart healthy diet  Abdominal pain -Patient with a CT abdomen that demonstrated cholelithiasis without acute cholecystitis.  -Patient without abdominal pain, nausea, vomiting or any other acute complaints.  -We will continue monitoring.  -Abdominal pain most likely associated with pneumonia/UTI.  Physical deconditioning -The patient has been seen by physical therapy/occupational therapy and recommendations are for skilled nursing facility for further rehabilitation and care prior to returning home. -Family and patient in  agreement; social worker consulted and facility picked; patient will be  discharge to facility on 06/03/17  Dysphagia/esophageal dysmotility -Upright position when eating and maintaining upright position for 40-60 minutes after eating -Continue PPI -Dysphagia 3 diet with liquids recommended -Speech therapy to reevaluate after discharge for further adjustment as needed into the patient's diet consistency.  Protein calorie malnutrition: Moderate -Continue feeding supplements as recommended by nutritional services.  Procedures:  See below for x-ray reports   Consultations:  Pulmonary service   Discharge Exam: Vitals:   06/03/17 0959 06/03/17 1352  BP: (!) 125/51 (!) 123/55  Pulse: 86 81  Resp:  18  Temp:  98.4 F (36.9 C)    General exam: Patient is feeling much better, denies chest pain, no nausea or vomiting. Oriented 3, able to follow commands and currently with good oxygen saturation on room air. Respiratory system: No wheezing, good air movement bilaterally; no crackles positive for scattered rhonchi (right more than left).  Cardiovascular system: No rubs, no gallops, no murmurs. S1 and S2 appreciated on exam.    Gastrointestinal system: Soft, nontender, nondistended, positive bowel sounds.  Central nervous system: Cranial nerves intact, moving 4 limbs and no focal deficits appreciated. Extremities: No edema, no cyanosis, no clubbing. Skin: No open wounds, some chronic venous stasis dermatitis on her lower extremities. Psychiatry: Alert, awake and oriented 3, stable, no hallucinations, patient following commands appropriately.   Discharge Instructions   Discharge Instructions    Diet - low sodium heart healthy    Complete by:  As directed    Discharge instructions    Complete by:  As directed    Maintain adequate hydration  Take Medications as prescribed 5 more days of antibiotics at discharge Use incentive spirometry and flutter valve as instructed  Repeat CXR in  2-3 weeks to assure resolution/stability of pulmonary infiltrates. Rehabilitation and physical therapy as per SNF protocol     Current Discharge Medication List    START taking these medications   Details  cefUROXime (CEFTIN) 250 MG tablet Take 1 tablet (250 mg total) by mouth 2 (two) times daily with a meal.    feeding supplement, ENSURE ENLIVE, (ENSURE ENLIVE) LIQD Take 237 mLs by mouth 2 (two) times daily between meals.    Ipratropium-Albuterol (COMBIVENT RESPIMAT) 20-100 MCG/ACT AERS respimat Inhale 1 puff into the lungs every 6 (six) hours as needed for wheezing or shortness of breath.    pantoprazole (PROTONIX) 20 MG tablet Take 1 tablet (20 mg total) by mouth daily at 12 noon.      CONTINUE these medications which have NOT CHANGED   Details  amLODipine (NORVASC) 5 MG tablet Take 5 mg by mouth daily.    aspirin EC 81 MG tablet Take 81 mg by mouth every 3 (three) days.    atenolol (TENORMIN) 25 MG tablet Take by mouth 2 (two) times daily.    Multiple Vitamin (MULTIVITAMIN WITH MINERALS) TABS tablet Take 1 tablet by mouth daily.    multivitamin-lutein (OCUVITE-LUTEIN) CAPS capsule Take 1 capsule by mouth daily.    oxybutynin (DITROPAN) 5 MG tablet Take 5 mg by mouth 2 (two) times daily.    senna (SENOKOT) 8.6 MG TABS tablet Take 1 tablet by mouth 2 (two) times daily.      STOP taking these medications     cephALEXin (KEFLEX) 500 MG capsule        Allergies  Allergen Reactions  . Erythromycin   . Penicillins     Contact information for follow-up providers    Merlene Laughter, MD. Schedule an appointment as soon  as possible for a visit in 2 week(s).   Specialty:  Internal Medicine Contact information: [address] [place] [address] [phone]            Contact information for after-discharge care    Destination    [facility] Follow up.   Specialty:  Skilled Nursing Facility Contact information: [address] [place] [address] [phone]                  The results of significant diagnostics from this hospitalization (including imaging, microbiology, ancillary and laboratory) are listed below for reference.    Significant Diagnostic Studies: Dg Chest 2 View  Result Date: 06/03/2017 CLINICAL DATA:  83 year old female with history of pneumonia. EXAM: CHEST  2 VIEW COMPARISON:  Chest x-ray 05/31/2017. FINDINGS: Lung volumes are low. There continues to be bibasilar opacities (right greater than left). Minimal improved aeration is noted, particularly in the right lower lobe. Trace bilateral pleural effusions. No evidence of pulmonary edema. Heart size is normal. Upper mediastinal contours are within normal limits. Aortic atherosclerosis. IMPRESSION: 1. Although there are persistent bibasilar (right greater than left) opacities which likely reflect a combination of airspace consolidation (i.e., pneumonia) and some degree of subsegmental atelectasis, aeration has slightly improved compared to the prior study. Follow-up radiographs are recommended in the next 1-2 weeks to ensure continued resolution of pneumonia. 2. Trace bilateral pleural effusions. 3. Aortic atherosclerosis. Electronically Signed   By: Trudie Reed M.D.   On: 06/03/2017 10:43   Dg Chest 2 View  Result Date: 05/31/2017 CLINICAL DATA:  83 year old female with history productive cough. EXAM: CHEST  2 VIEW COMPARISON:  Chest x-ray 05/30/2017. FINDINGS: Ill-defined opacities in the lower lobes of the lungs bilaterally (right greater than left). Probable trace right pleural effusion. No significant left pleural effusion. No evidence of pulmonary edema. Heart size is normal. Upper mediastinal contours are within normal limits. Atherosclerosis in the thoracic aorta. Coronary artery stents are noted. IMPRESSION: 1. Bibasilar opacities (right greater than left) very similar to prior study from 05/30/2017. On the right, this  is compatible with pneumonia which appears to involve the entire right lower lobe at this time. On the left, some component of atelectasis is likely present, however, additional airspace disease is also suspected. 2. Trace right pleural effusion. 3. Aortic atherosclerosis. Electronically Signed   By: Trudie Reed M.D.   On: 05/31/2017 09:45   Dg Chest 2 View  Result Date: 05/29/2017 CLINICAL DATA:  83 year old female with productive cough for about a week and fever. EXAM: CHEST  2 VIEW COMPARISON:  Chest radiograph dated 05/27/2017 FINDINGS: There is shallow inspiration. There are hazy bibasilar densities, right greater than left which appear more prominent and more confluent compared to the prior radiograph. This may represent atelectatic changes/scarring appearing more prominent secondary to decreased inspiratory effort compared to prior radiograph. However, developing infiltrate is not excluded. Correlation with clinical exam is recommended. There is no pleural effusion or pneumothorax. Probable mild bronchiectatic changes. The cardiac silhouette is within normal limits. Coronary vascular stent noted. There is atherosclerotic calcification of the thoracic aorta. There is osteopenia with degenerative changes of the spine. No acute osseous pathology. IMPRESSION: Hazy bibasilar densities, right greater than left may represent atelectatic changes, although developing infiltrate is not excluded. Correlation with clinical exam recommended. Electronically Signed   By: Elgie Collard M.D.   On: 05/29/2017 00:34   Dg Chest 2 View  Result Date: 05/27/2017  CLINICAL DATA:  Cough. EXAM: CHEST  2 VIEW COMPARISON:  None. FINDINGS: The heart size and mediastinal contours are within normal limits. Atherosclerosis of thoracic aorta is noted. No pneumothorax or pleural effusion is noted. Mild bibasilar atelectasis or scarring is noted. The visualized skeletal structures are unremarkable. IMPRESSION: Aortic  atherosclerosis. Mild bibasilar subsegmental atelectasis or scarring. Electronically Signed   By: Lupita Raider, M.D.   On: 05/27/2017 21:49   Ct Abdomen Pelvis W Contrast  Result Date: 05/29/2017 CLINICAL DATA:  Abdominal pain. Admitted for urinary tract infection and probable pneumonia. Sepsis clinically. Metabolic encephalopathy. EXAM: CT ABDOMEN AND PELVIS WITH CONTRAST TECHNIQUE: Multidetector CT imaging of the abdomen and pelvis was performed using the standard protocol following bolus administration of intravenous contrast. CONTRAST:  80mL ISOVUE-300 IOPAMIDOL (ISOVUE-300) INJECTION 61% COMPARISON:  Chest radiographs including earlier today. FINDINGS: Lower chest: Motion degradation at the lung bases. Patchy right lower lobe airspace disease. Minimal left lower lobe dependent atelectasis. Probable scarring in the inferior right middle lobe and posterior lingula. The posterior lingular nodule measures 4 mm on image 19/series 6. Normal heart size with multivessel coronary artery atherosclerosis. Small hiatal hernia with subtle contrast level in the distal esophagus on image 11/series 2. Trace right pleural fluid. Hepatobiliary: Calcification in the liver is likely related to old granulomatous disease. The gallbladder is stone filled, without surrounding inflammation or biliary duct dilatation. Pancreas: Mild fatty replacement involving the pancreatic body, head, and uncinate process. No duct dilatation or dominant mass. Spleen: Old granulomatous disease in the spleen. Adrenals/Urinary Tract: Normal adrenal glands. Partially malrotated right kidney. Too small to characterize lesions in both kidneys. No hydronephrosis. Normal urinary bladder. Stomach/Bowel: Normal remainder of the stomach. Mild mid abdominal motion degradation. Extensive colonic diverticulosis. Wall thickening involving the sigmoid is likely related to muscular hypertrophy. No convincing evidence of surrounding inflammation Normal terminal  ileum. Appendix is not visualized but there is no evidence of right lower quadrant inflammation. Normal small bowel. Vascular/Lymphatic: Advanced aortic and branch vessel atherosclerosis. No abdominopelvic adenopathy. Reproductive: Hysterectomy.  No adnexal mass. Other: No significant free fluid. Mild pelvic floor laxity. Tiny fat containing right inguinal hernia. No free intraperitoneal air. Gas in the anterior abdominal wall is eccentric right and likely iatrogenic on image 33/series 2. Musculoskeletal: Mild osteopenia. Lumbosacral spondylosis with likely secondary trace L4-5 anterolisthesis. Multilevel disc bulges. IMPRESSION: 1. Mildly motion degraded exam. 2. Cholelithiasis. No evidence of acute cholecystitis and no other explanation for abdominal pain. 3. Right lower lobe airspace disease is most consistent with either infection or aspiration. Small hiatal hernia with distal esophageal contrast level suggests dysmotility or gastroesophageal reflux and could predispose the patient to aspiration. 4. Trace right pleural fluid. 5.  Coronary artery atherosclerosis. Aortic atherosclerosis. 6. 4 mm lingular nodule. No follow-up needed if patient is low-risk. Non-contrast chest CT can be considered in 12 months if patient is high-risk. This recommendation follows the consensus statement: Guidelines for Management of Incidental Pulmonary Nodules Detected on CT Images: From the Fleischner Society 2017; Radiology 2017; 284:228-243. Electronically Signed   By: Jeronimo Greaves M.D.   On: 05/29/2017 12:22   Dg Chest Port 1 View  Result Date: 05/30/2017 CLINICAL DATA:  Aspiration pneumonia. EXAM: PORTABLE CHEST 1 VIEW COMPARISON:  05/29/2017. FINDINGS: Heart size normal. Coronary stents noted. Right lower lobe infiltrate noted consistent with pneumonia. Findings progressed from prior exam. Associated small right pleural effusion. Mild left base infiltrate with small left pleural effusion cannot be completely excluded. No  pneumothorax. IMPRESSION: 1.  Right lower lobe infiltrate noted consistent pneumonia. Findings progressed from prior exam. Associated small right pleural effusion. 2. Cannot exclude mild left base infiltrate with tiny left pleural effusion . 3. Mild cardiomegaly. Coronary stents noted. No pulmonary venous congestion. Electronically Signed   By: Maisie Fus  Register   On: 05/30/2017 16:19   Dg Swallowing Func-speech Pathology  Result Date: 05/31/2017 Objective Swallowing Evaluation: Type of Study: MBS-Modified Barium Swallow Study Patient Details Name: Onelia Cadmus MRN: [id number] Date of Birth: [birth year] Today's Date: 05/31/2017 Time: SLP Start Time (ACUTE ONLY): 1150-SLP Stop Time (ACUTE ONLY): 1225 SLP Time Calculation (min) (ACUTE ONLY): 35 min Past Medical History: Past Medical History: Diagnosis Date . Hypertension  . Macular degeneration  . Myocardial infarct (HCC)  . Urinary incontinence  . Venous stasis  Past Surgical History: Past Surgical History: Procedure Laterality Date . CORONARY ANGIOPLASTY WITH STENT PLACEMENT   HPI: Ptis a 84 y.o.femalewith medical history significant for hypertension, CAD, stent placement, macular degeneration, and urinary incontinence, who presents with altered mental status, cough and abdominal pain- diagnosed with UTI, pna and encephalopathy.   Grandson reports concern for pt having some baseline dysphagia as he has witnessed her coughing during meals on occasion.  In addition, pt admits to pill dysphagia, stating they lodge in throat and require her to bring them back up into mouth and swallow again.  Pt pointed near right vallecular region when articulating re: pill stasis.  She denies weight loss, admits to requiring heimlich manuever x1 approximately 20 years ago due to choking on steak.  Pt denies problems swallowing foods or liquids but admits to some reflux symptoms.  CT also showed right lower lobe airspace disease most consistent with either infection or aspiration.  Per Abdomen CT, pt also with "Small hiatal hernia with distal esophageal contrast level suggests dysmotility or gastroesophageal reflux and could predispose the patient to aspiration."  Clinical swallow evaluation completed with concerns for primary esophageal symptoms/signs - MBS ordered to rule out oropharyngeal deficit.   Subjective: pt alert Assessment / Plan / Recommendation CHL IP CLINICAL IMPRESSIONS 05/31/2017 Clinical Impression Pt with only minimal oropharyngeal dysphagia without aspiration/penetration of any consistency tested.  She did have trace pharyngeal residuals most notably with liquids. Barium tablet taken with thin lodged at vallecular space - requiring 2 more thin liquid boluses to transit into esophagus.  Pt did not report overt sensation to barium tablet stasis but volitionally drank extra liquid to clear, therefore suspect some sensation present.  Of note, pt does report premorbid occasional difficulties with sensing pills lodging pointing near right vallecular space.  Recommend advance diet to maximize pt intake.  Pt states she needs foods that have liquids like pudding/applesauce - suspect moist foods desired and dry crumbly foods should be avoided.  Using video monitor, educated pt to findings/recommendations.    SLP Visit Diagnosis Dysphagia, unspecified (R13.10) Attention and concentration deficit following -- Frontal lobe and executive function deficit following -- Impact on safety and function Mild aspiration risk;Moderate aspiration risk   CHL IP TREATMENT RECOMMENDATION 05/31/2017 Treatment Recommendations Therapy as outlined in treatment plan below   Prognosis 05/31/2017 Prognosis for Safe Diet Advancement Good Barriers to Reach Goals -- Barriers/Prognosis Comment -- CHL IP DIET RECOMMENDATION 05/31/2017 SLP Diet Recommendations Dysphagia 3 (Mech soft) solids;Thin liquid, extra gravies/sauces Liquid Administration via Cup;Straw Medication Administration Whole meds with puree- start and  follow with liquids Compensations Slow rate;Small sips/bites;Follow solids with liquid, start meals with liquids Postural Changes Remain semi-upright after after feeds/meals (Comment);Seated upright  at 90 degrees   No flowsheet data found.  CHL IP FOLLOW UP RECOMMENDATIONS 05/31/2017 Follow up Recommendations None   CHL IP FREQUENCY AND DURATION 05/31/2017 Speech Therapy Frequency (ACUTE ONLY) min 2x/week Treatment Duration 2 weeks      CHL IP ORAL PHASE 05/31/2017 Oral Phase Impaired Oral - Pudding Teaspoon -- Oral - Pudding Cup -- Oral - Honey Teaspoon -- Oral - Honey Cup -- Oral - Nectar Teaspoon -- Oral - Nectar Cup WFL Oral - Nectar Straw -- Oral - Thin Teaspoon WFL Oral - Thin Cup WFL Oral - Thin Straw WFL Oral - Puree WFL;Piecemeal swallowing;Premature spillage Oral - Mech Soft -- Oral - Regular WFL;Premature spillage Oral - Multi-Consistency -- Oral - Pill Reduced posterior propulsion Oral Phase - Comment --  CHL IP PHARYNGEAL PHASE 05/31/2017 Pharyngeal Phase Impaired Pharyngeal- Pudding Teaspoon -- Pharyngeal -- Pharyngeal- Pudding Cup -- Pharyngeal -- Pharyngeal- Honey Teaspoon -- Pharyngeal -- Pharyngeal- Honey Cup -- Pharyngeal -- Pharyngeal- Nectar Teaspoon -- Pharyngeal -- Pharyngeal- Nectar Cup WFL Pharyngeal -- Pharyngeal- Nectar Straw -- Pharyngeal -- Pharyngeal- Thin Teaspoon WFL;Pharyngeal residue - valleculae;Pharyngeal residue - pyriform Pharyngeal -- Pharyngeal- Thin Cup [facility];Pharyngeal residue - valleculae;Pharyngeal residue - pyriform Pharyngeal -- Pharyngeal- Thin Straw WFL;Pharyngeal residue - valleculae;Pharyngeal residue - pyriform Pharyngeal -- Pharyngeal- Puree WFL Pharyngeal -- Pharyngeal- Mechanical Soft -- Pharyngeal -- Pharyngeal- Regular WFL Pharyngeal -- Pharyngeal- Multi-consistency -- Pharyngeal -- Pharyngeal- Pill Reduced tongue base retraction;Pharyngeal residue - valleculae Pharyngeal -- Pharyngeal Comment pt did not admit to sensation of pill lodging in vallecular space but  independently consumed 2 extra liquid boluses to transit into esophagus  CHL IP CERVICAL ESOPHAGEAL PHASE 05/31/2017 No flowsheet data found. Donavan Burnet, MS [facility] SLP [phone]               Microbiology: Recent Results (from the past 240 hour(s))  Urine culture     Status: Abnormal   Collection Time: 05/27/17 10:00 PM  Result Value Ref Range Status   Specimen Description URINE, RANDOM  Final   Special Requests NONE  Final   Culture >=100,000 COLONIES/mL ESCHERICHIA COLI (A)  Final   Report Status 05/30/2017 FINAL  Final   Organism ID, Bacteria ESCHERICHIA COLI (A)  Final      Susceptibility   Escherichia coli - MIC*    AMPICILLIN 4 SENSITIVE Sensitive     CEFAZOLIN <=4 SENSITIVE Sensitive     CEFTRIAXONE <=1 SENSITIVE Sensitive     CIPROFLOXACIN >=4 RESISTANT Resistant     GENTAMICIN 4 SENSITIVE Sensitive     IMIPENEM 0.5 SENSITIVE Sensitive     NITROFURANTOIN <=16 SENSITIVE Sensitive     TRIMETH/SULFA <=20 SENSITIVE Sensitive     AMPICILLIN/SULBACTAM <=2 SENSITIVE Sensitive     PIP/TAZO <=4 SENSITIVE Sensitive     Extended ESBL NEGATIVE Sensitive     * >=100,000 COLONIES/mL ESCHERICHIA COLI  Blood culture (routine x 2)     Status: None   Collection Time: 05/28/17 10:40 PM  Result Value Ref Range Status   Specimen Description BLOOD LEFT ANTECUBITAL  Final   Special Requests   Final    BOTTLES DRAWN AEROBIC AND ANAEROBIC Blood Culture adequate volume   Culture   Final    NO GROWTH 5 DAYS Performed at [facility], [address] [place], [place] [address]    Report Status 06/03/2017 FINAL  Final  Blood culture (routine x 2)     Status: None   Collection Time: 05/28/17 10:45 PM  Result Value  Ref Range Status   Specimen Description BLOOD RIGHT ANTECUBITAL  Final   Special Requests   Final    BOTTLES DRAWN AEROBIC AND ANAEROBIC Blood Culture adequate volume   Culture   Final    NO GROWTH 5 DAYS Performed at [facility], [address] [place], [place]  [id number]    Report Status 06/03/2017 FINAL  Final  MRSA PCR Screening     Status: None   Collection Time: 05/29/17  3:50 AM  Result Value Ref Range Status   MRSA by PCR NEGATIVE NEGATIVE Final    Comment:        The GeneXpert MRSA Assay (FDA approved for NASAL specimens only), is one component of a comprehensive MRSA colonization surveillance program. It is not intended to diagnose MRSA infection nor to guide or monitor treatment for MRSA infections.   Culture, expectorated sputum-assessment     Status: None   Collection Time: 05/31/17 10:07 AM  Result Value Ref Range Status   Specimen Description SPUTUM  Final   Special Requests Normal  Final   Sputum evaluation THIS SPECIMEN IS ACCEPTABLE FOR SPUTUM CULTURE  Final   Report Status 05/31/2017 FINAL  Final  Culture, respiratory (NON-Expectorated)     Status: None (Preliminary result)   Collection Time: 05/31/17 10:07 AM  Result Value Ref Range Status   Specimen Description SPUTUM  Final   Special Requests Normal Reflexed from [id number]  Final   Gram Stain   Final    MODERATE WBC PRESENT,BOTH PMN AND MONONUCLEAR NO ORGANISMS SEEN    Culture   Final    CULTURE REINCUBATED FOR BETTER GROWTH Performed at [facility], [address] [place], [place] [address]    Report Status PENDING  Incomplete     Labs: Basic Metabolic Panel:  Recent Labs Lab 05/27/17 2134 05/28/17 2236 05/30/17 1032 05/31/17 0605 06/02/17 0611  NA 131* 131* 130* 130* 133*  K 4.3 4.1 3.6 3.5 3.6  CL 98* 100* 102 101 103  CO2 24 24 19* 20* 25  GLUCOSE 141* 122* 162* 108* 100*  BUN 18 13 10 10 12   CREATININE 0.77 0.73 0.70 0.56 0.61  CALCIUM 10.0 9.5 8.4* 8.8* 9.1  MG  --   --   --  1.7  --   PHOS  --   --   --  1.8*  --    Liver Function Tests:  Recent Labs Lab 05/27/17 2134 05/28/17 2236  AST 27 28  ALT 17 20  ALKPHOS 82 80  BILITOT 0.5 0.5  PROT 7.3 6.7  ALBUMIN 3.9 3.3*    Recent Labs Lab 05/29/17 0631  LIPASE 25    CBC:  Recent Labs Lab 05/27/17 2134 05/28/17 2235 05/30/17 1032 05/31/17 0605 06/02/17 0611  WBC 11.1* 14.3* 14.1* 13.8* 11.6*  NEUTROABS 9.0* 11.9*  --  11.4*  --   HGB 14.3 13.0 11.5* 12.3 12.0  HCT 41.2 37.2 32.9* 34.5* 34.7*  MCV 92.4 90.5 89.9 88.0 89.0  PLT 180 181 208 247 332   BNP (last 3 results)  Recent Labs  05/31/17 0605  BNP 315.6*    Signed:  Vassie LollMadera, Noora Locascio MD.  [facility] 06/03/2017, 2:16 PM

## 2017-06-03 NOTE — Progress Notes (Signed)
Date:  June 03, 2017  Chart reviewed for concurrent status and case management needs.  Will continue to follow patient progress.  Community acquired pneumonia/aspiration pneumonia and pneumonitis. -Chest x-ray with right lower lobe infiltrate; will repeat in a.m. in order to follow up stability and improvement/resolution after being on antibiotics. -Patient now afebrile, with continue improvement in her WBCs, no tachypnea and significant improvement in her respiratory status. Currently with good oxygen saturation on room air.  -She will continue treatment with antibiotics, but is going to be transition to Ceftin orally ; will continue Mucinex, the use of IS and flutter valve. -CT abdomen suggested esophageal dysmotility; patient with MBS that demonstrated no oropharyngeal component on dysphagia. Continue dysphagia 3 and thin liquids.  -Will continue PPI.  -Continue DuoNeb and Pulmicort  -Appreciate assistance and recommendations by pulmonary services.  Discharge Planning: following for needs  Expected discharge date: [id number]  Marcelle SmilingRhonda Joyann Spidle, BSN, AirmontRN3, [place]M   [phone]

## 2017-06-03 NOTE — Progress Notes (Signed)
Date: June 03, 2017 Discharge orders review for case management needs.  None found Rhonda Davis, BSN, RN3, CCM: [phone] 

## 2017-06-03 NOTE — Progress Notes (Signed)
Name: Sara Clark MRN: [id number] DOB: 03-30-1920    ADMISSION DATE:  05/28/2017 CONSULTATION DATE:  05/30/17  REFERRING MD :  Dr. Caleb Popp / [facility]   CHIEF COMPLAINT: Confusion, PNA.   HISTORY OF PRESENT ILLNESS:  83 y/o F who presented to [facility] on 5/28 with confusion.  Her family reported that she had recently had a URI and had managed with Mucinex.  The entire family was sick from the viral illness.  She was worked up and found to have fever & urinalysis consistent with UTI.  She was given antibiotics (Keflex) for UTI and discharged with family.    At baseline, she gets around with a walker.  She recently moved from [place] to [place] to an apartment.  She no longer drives.  She is a never smoker but was exposed to second hand smoke from childhood into her marriage until her husband passed.       She returned to the ER on 5/29 via EMS with reports of lower abdominal pain, weakness, productive cough and increased confusion. Prior urine culture from 5/28 grew > 100k E-Coli (S- rocephin, R- Cipro).  CXR was evaluated on admit and was concerning for RLL infiltrate.  Initial WBC 11.1 > 14.1 on 5/31, negative lactate, negative PCT.  CT of the abdomen was completed with reports of abdominal pain which showed cholelithiasis without evidence of acute cholecystitis, RLL airspace disease, small hiatal hernia with distal esophageal contrast level suggestive of dysmotility or GERD, trace right pleural fluid, CAD, 4mm lingular nodule.  The patient was admitted by [facility] for further evaluation.  She was treated with IV doxycycline, rocephin and flagyl.  The patient remains on room air with saturations documented from 92-97%.  She was evaluated 5/31 by SLP for dysphagia and found to need a dysphagia 2 (fine chop) diet with thin liquids.    PCCM consulted for evaluation of PNA.     SUBJECTIVE:   Feels better  VITAL SIGNS: Temp:  [98 F (36.7 C)-98.7 F (37.1 C)] 98.7 F (37.1 C) (06/04  0545) Pulse Rate:  [74-91] 86 (06/04 0959) Resp:  [18-20] 20 (06/04 0545) BP: (123-143)/(48-78) 125/51 (06/04 0959) SpO2:  [90 %-94 %] 91 % (06/04 0759)  General appearance:  83 Year old female, well nourished NAD, conversant  Eyes: anicteric sclerae, moist conjunctivae; PERRL, EOMI bilaterally. Mouth:  membranes and no mucosal ulcerations; normal hard and soft palate Neck: Trachea midline; neck supple, no JVD Lungs/chest: CTA, with normal respiratory effort and no intercostal retractions CV: RRR, no MRGs  Abdomen: Soft, non-tender; no masses or HSM Extremities: No peripheral edema or extremity lymphadenopathy Skin: Normal temperature, turgor and texture; no rash, ulcers or subcutaneous nodules Neuro/ Psych: Appropriate affect, alert and oriented to person, place and time    Recent Labs Lab 05/30/17 1032 05/31/17 0605 06/02/17 0611  NA 130* 130* 133*  K 3.6 3.5 3.6  CL 102 101 103  CO2 19* 20* 25  BUN 10 10 12   CREATININE 0.70 0.56 0.61  GLUCOSE 162* 108* 100*     Recent Labs Lab 05/30/17 1032 05/31/17 0605 06/02/17 0611  HGB 11.5* 12.3 12.0  HCT 32.9* 34.5* 34.7*  WBC 14.1* 13.8* 11.6*  PLT 208 247 332    No results found.    SIGNIFICANT EVENTS  5/28  ER visit, treated for UTI & discharged home  5/29  Return to ER via EMS with confusion, AMS.  Concern for PNA, admitted per [facility]  5/31  PCCM consulted  for evaluation of PNA   STUDIES:  CT ABD/Pelvis 5/30 >> cholelithiasis without evidence of acute cholecystitis, RLL airspace disease, small hiatal hernia with distal esophageal contrast level suggestive of dysmotility or GERD, trace right pleural fluid, CAD, 4mm lingular nodule  ANTIBIOTICS:  Flagyl 5/30 >> 5/31 Doxycycline 5/30 >> 5/31 Rocephin 5/30 >> 6/3 ceftin 6/3>>>  CULTURES: UC 5/28 >> E-Coli >> S- rocephin, R- Cipro Urine Legionella 5/30 >>  Urine Strep Antigen 5/30 >> negative  BCx2 5/29 >>  Sputum 5/29 >>no orgs   ASSESSMENT / PLAN:  Right  lower lobe infiltrate c/w pneumonia/aspiration pneumonia.  PCXR personally reviewed: improving RLL airspace disease  Plan Cont ceftin to complete course (10d total abx) Aspiration/reflux precautions w/ dysphagia diet  Bid ppi Mobilize    Left lingular pulmonary nodule Plan F/u CT scan 12 mo if desires.   CAD & HTN  Plan  Per primary service   ECOLI UTI Plan ceftin   Physical deconditioning  Plan SNF   We will s/o call PRN   Simonne MartinetPeter E Shamal Stracener ACNP-BC [facility]/Critical [facility] Pager # [phone] OR # [phone] if no answer

## 2017-06-06 LAB — CULTURE, RESPIRATORY: SPECIAL REQUESTS: NORMAL

## 2017-06-06 LAB — CULTURE, RESPIRATORY W GRAM STAIN

## 2017-11-08 IMAGING — RF DG SWALLOWING FUNCTION - NRPT [facility]
11 series · 24 of 24 positions shown · non-contrast
Comparison: none

[Series 1: cp_standard · 0.34mm/px · 2 of 18 frames shown (1 of 11)]
[frame 2/18]
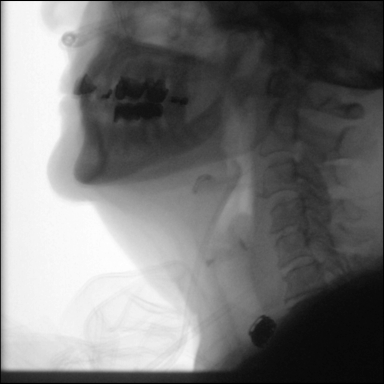
[frame 10/18]
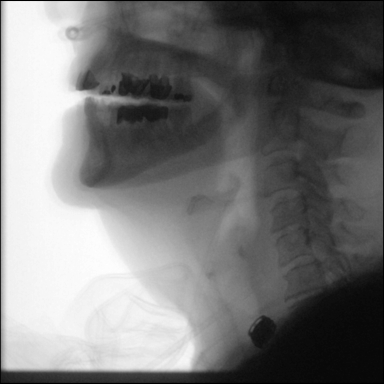

[Series 2: cp_standard · 0.34mm/px · 3 of 34 frames shown (2 of 11)]
[frame 2/34]
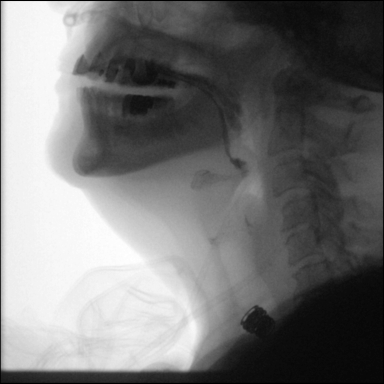
[frame 18/34]
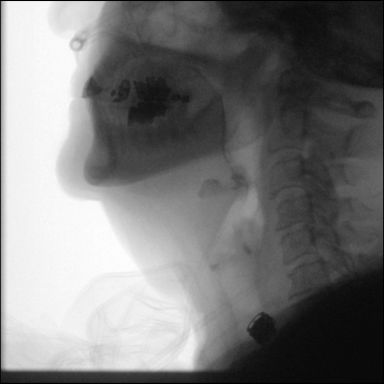
[frame 29/34]
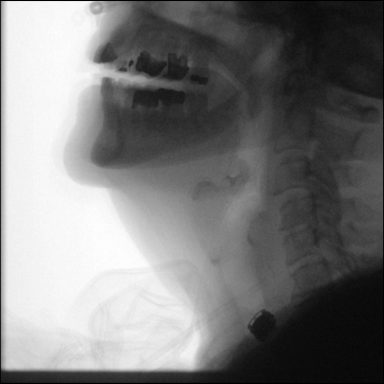

[Series 3: cp_standard · 0.34mm/px · 2 of 54 frames shown (3 of 11)]
[frame 28/54]
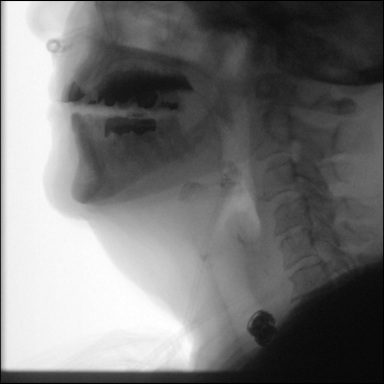
[frame 54/54]
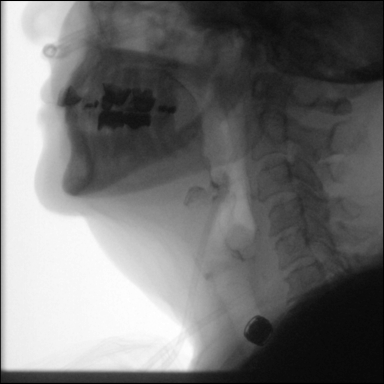

[Series 4: cp_standard · 0.34mm/px · 2 of 52 frames shown (4 of 11)]
[frame 27/52]
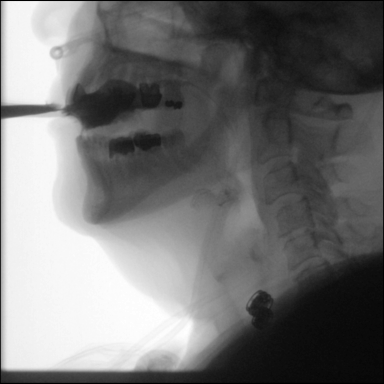
[frame 45/52]
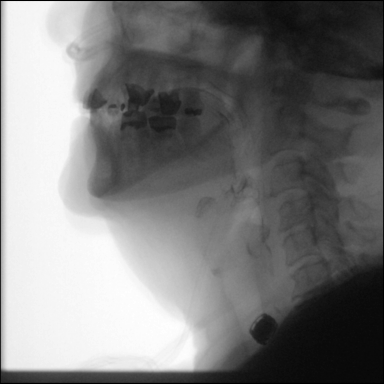

[Series 5: cp_standard · 0.34mm/px · 2 of 50 frames shown (5 of 11)]
[frame 8/50]
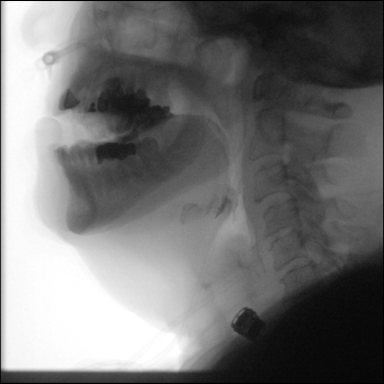
[frame 43/50]
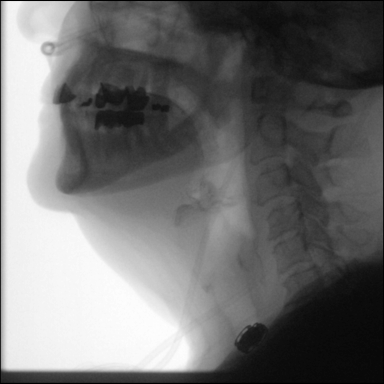

[Series 6: cp_standard · 0.34mm/px · 2 of 72 frames shown (6 of 11)]
[frame 27/72]
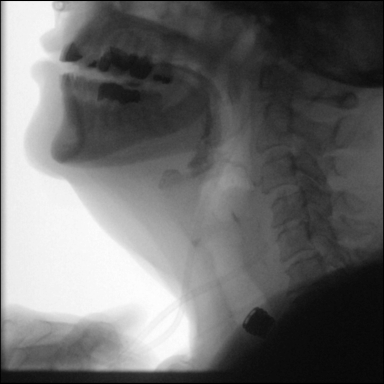
[frame 37/72]
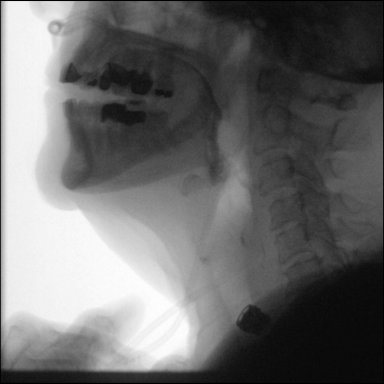

[Series 7: cp_standard · 0.34mm/px · 2 of 98 frames shown (7 of 11)]
[frame 15/98]
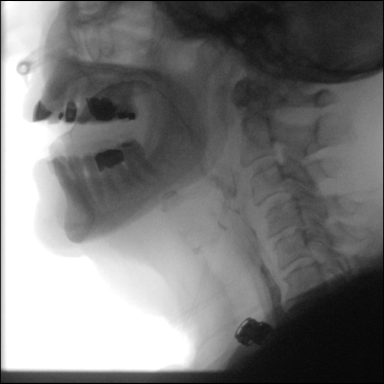
[frame 84/98]
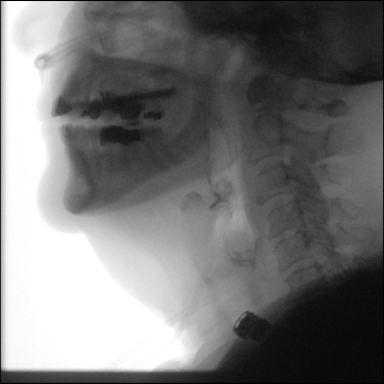

[Series 8: cp_standard · 0.34mm/px · 2 of 15 frames shown (8 of 11)]
[frame 3/15]
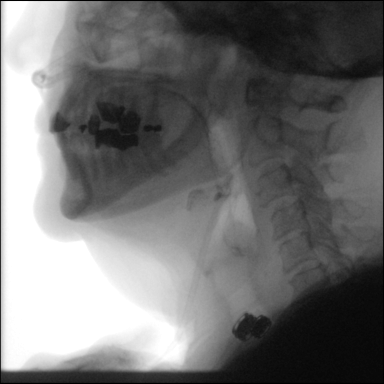
[frame 10/15]
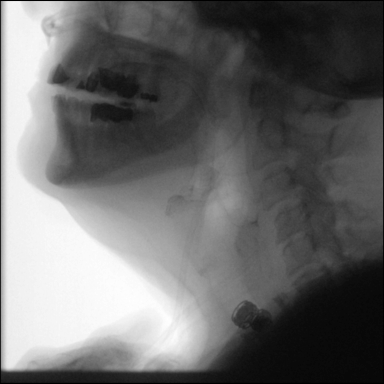

[Series 9: cp_standard · 0.34mm/px · 2 of 17 frames shown (9 of 11)]
[frame 3/17]
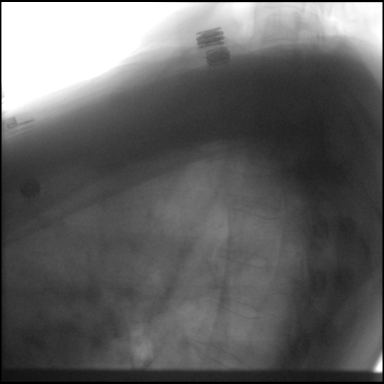
[frame 15/17]
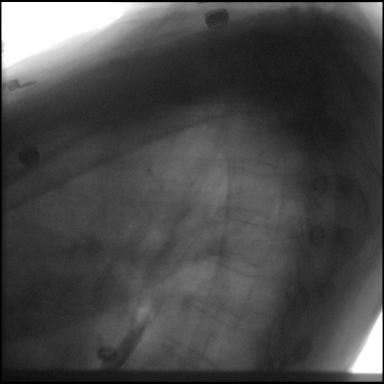

[Series 10: cp_standard · 0.34mm/px · 3 of 22 frames shown (10 of 11)]
[frame 4/22]
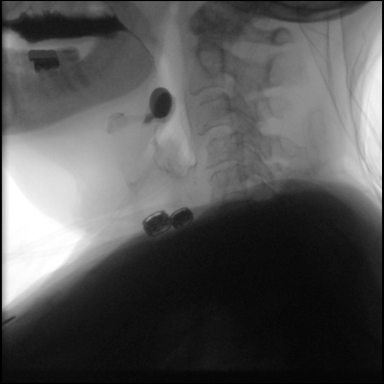
[frame 12/22]
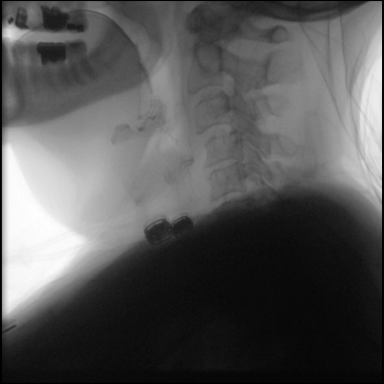
[frame 19/22]
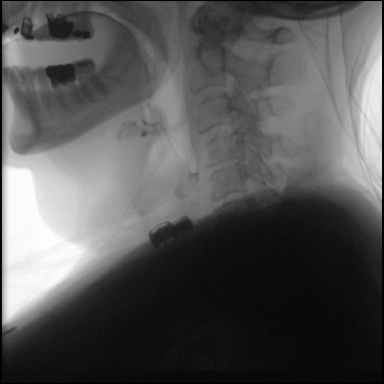

[Series 11: cp_standard · 0.34mm/px · 2 of 65 frames shown (11 of 11)]
[frame 10/65]
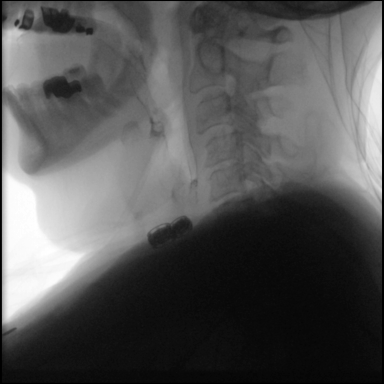
[frame 56/65]
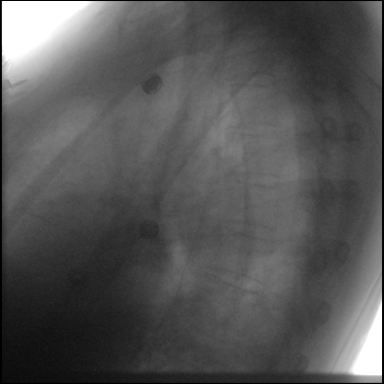

[24 of 24 positions shown; findings below may reference images not displayed]

FLUOROSCOPY FOR SWALLOWING FUNCTION STUDY:
Fluoroscopy was provided for swallowing function study, which was administered by a speech pathologist.  Final results and recommendations from this study are contained within the speech pathology report.

## 2017-11-08 IMAGING — DX DG CHEST 2V
2 series · 2 of 2 positions shown · non-contrast
Comparison: Chest x-ray 05/30/2017.

CLINICAL DATA: [AGE] female with history productive cough.

EXAM:
CHEST  2 VIEW

[chest lat]
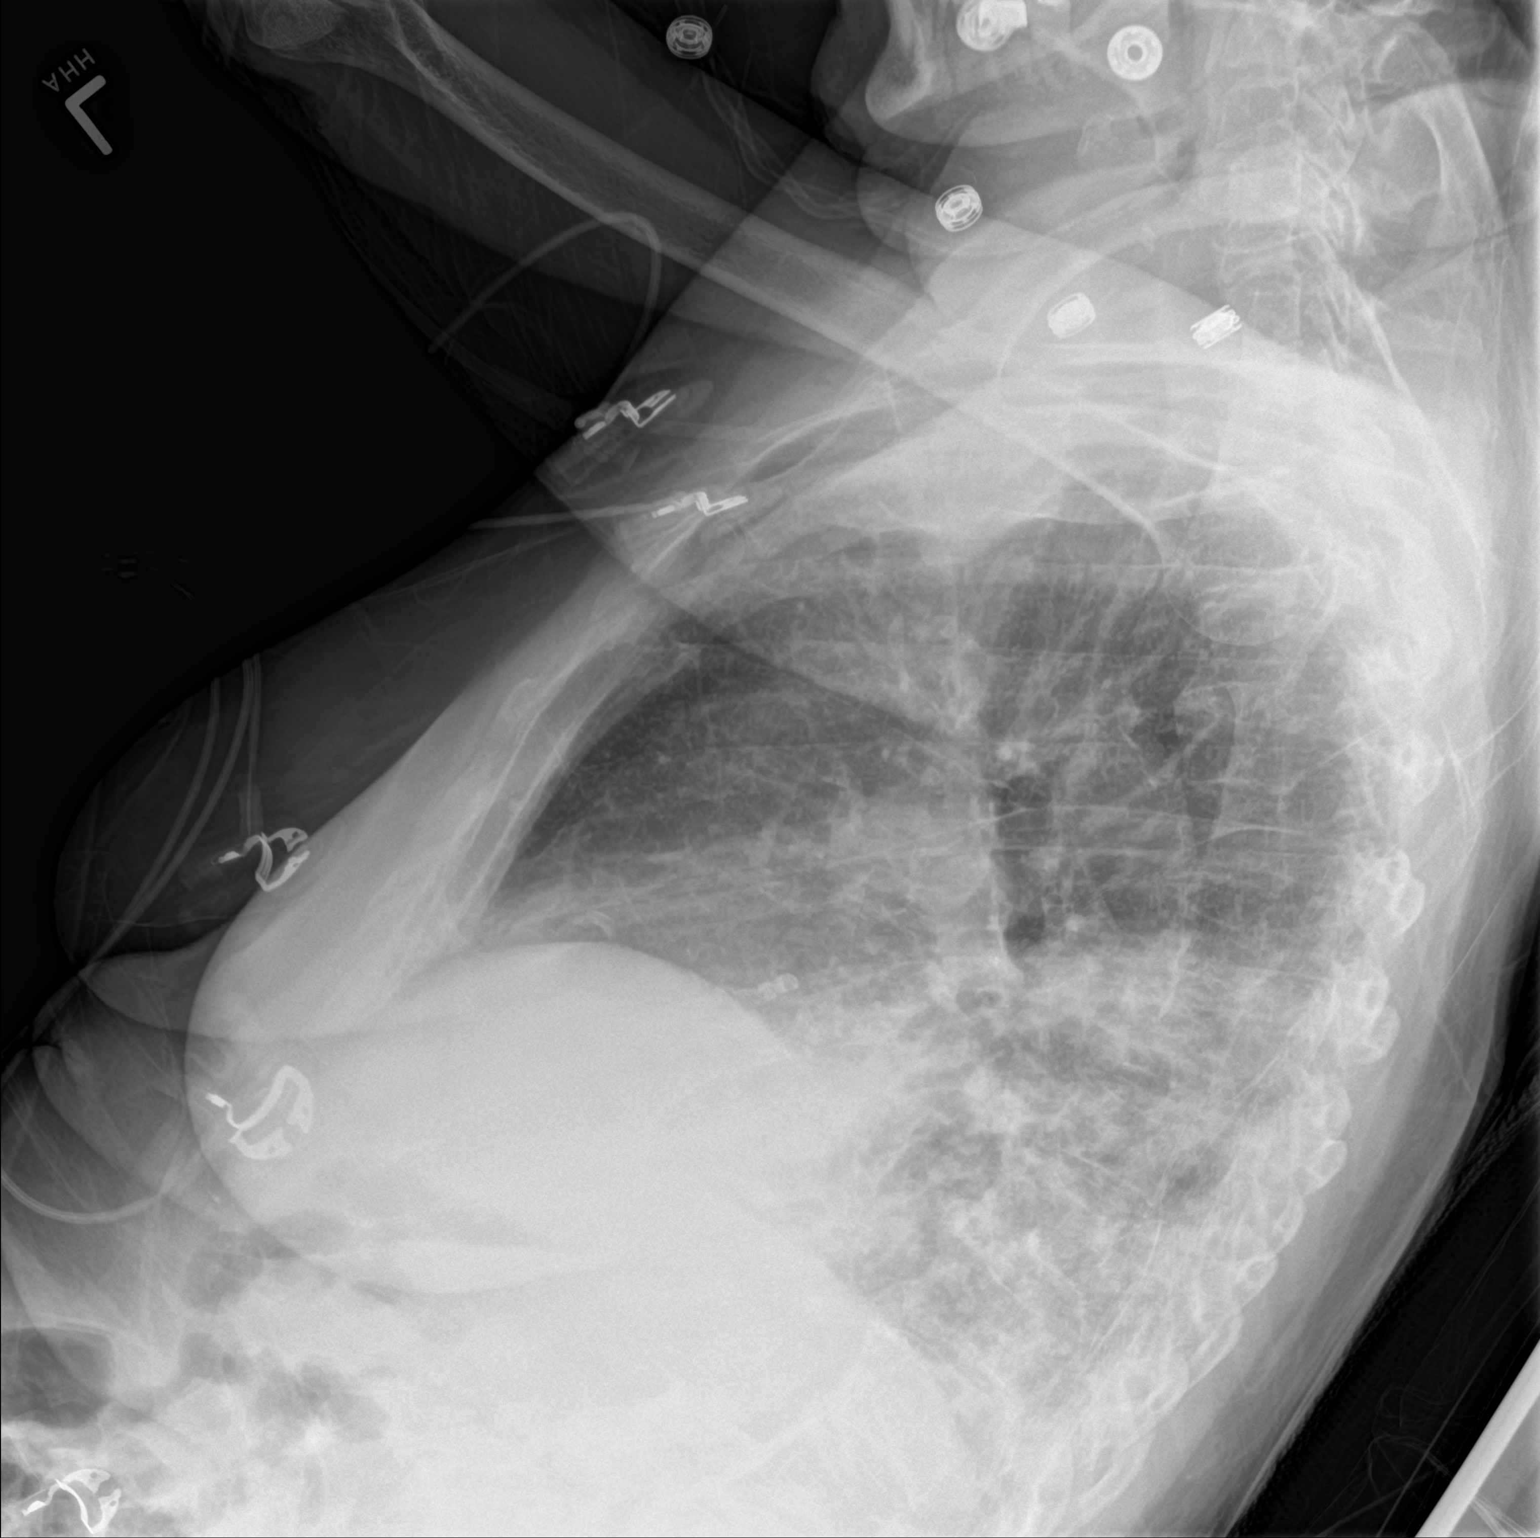

[chest ap]
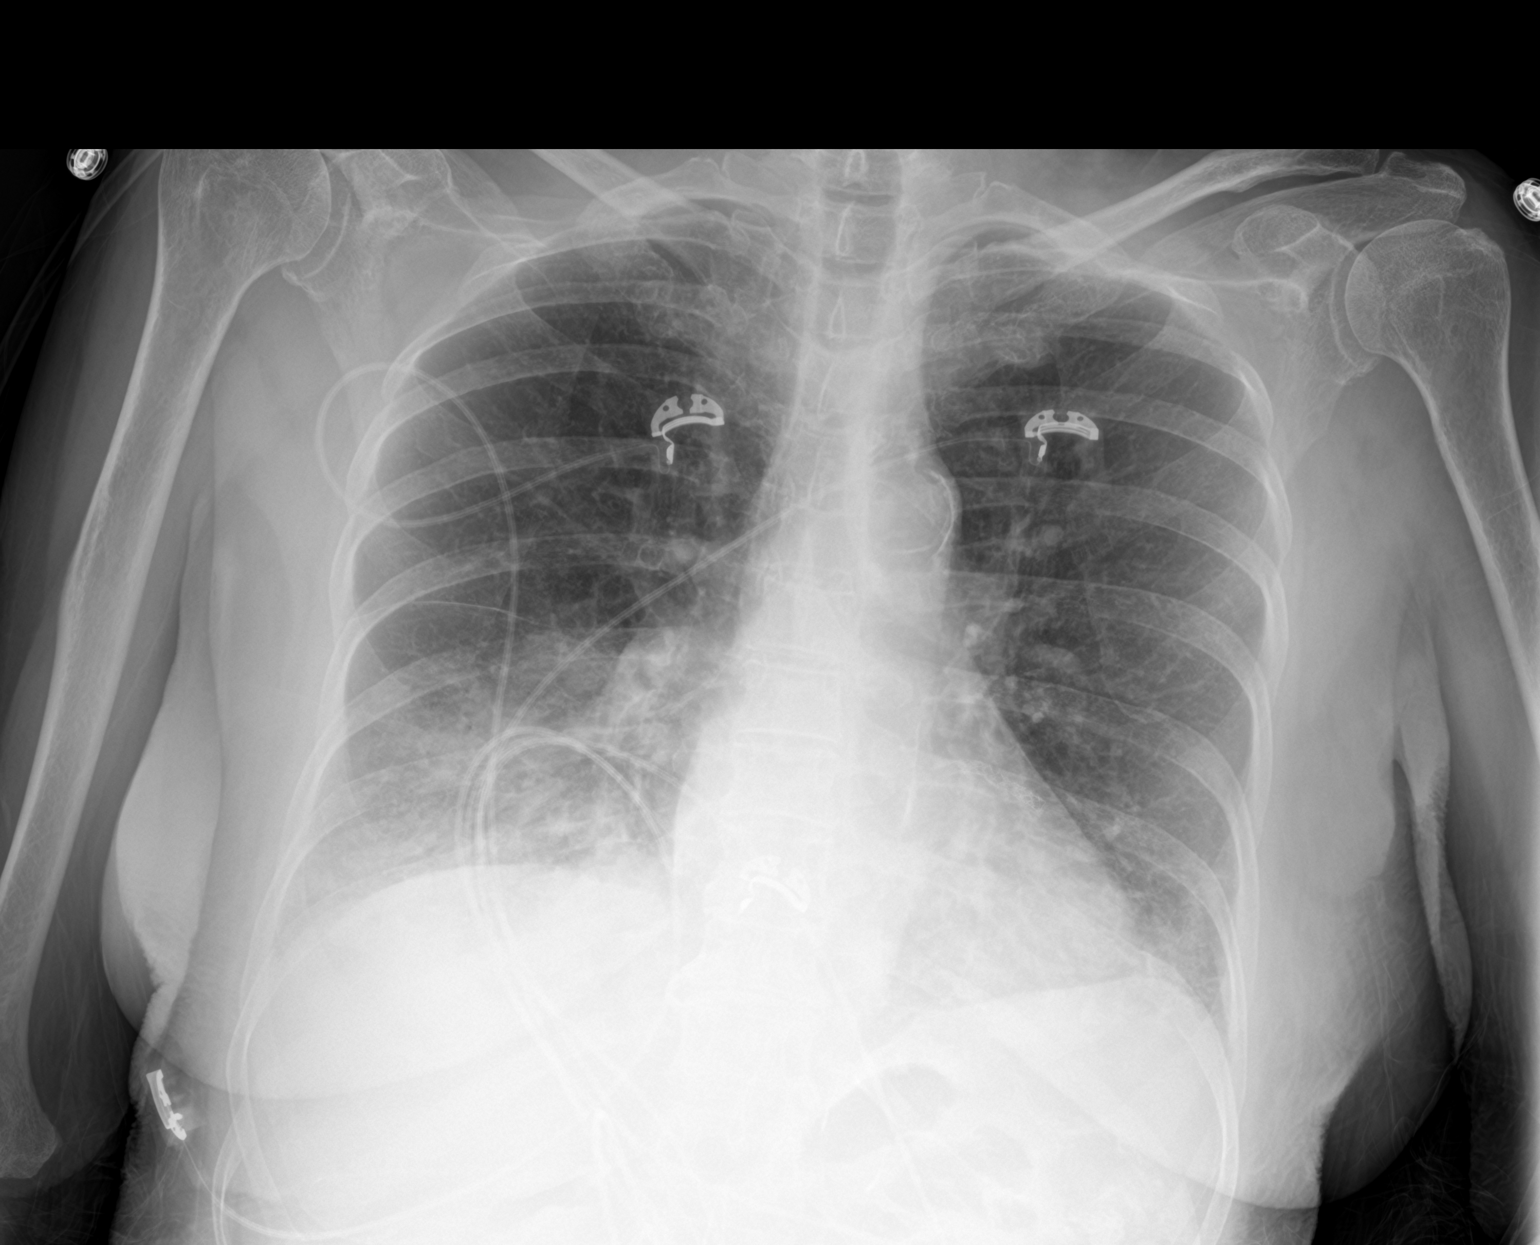

[2 of 2 positions shown; findings below may reference images not displayed]

FINDINGS: Ill-defined opacities in the lower lobes of the lungs bilaterally
(right greater than left). Probable trace right pleural effusion. No
significant left pleural effusion. No evidence of pulmonary edema.
Heart size is normal. Upper mediastinal contours are within normal
limits. Atherosclerosis in the thoracic aorta. Coronary artery
stents are noted.
IMPRESSION: 1. Bibasilar opacities (right greater than left) very similar to
prior study from 05/30/2017. On the right, this is compatible with
pneumonia which appears to involve the entire right lower lobe at
this time. On the left, some component of atelectasis is likely
present, however, additional airspace disease is also suspected.
2. Trace right pleural effusion.
3. Aortic atherosclerosis.

## 2018-01-01 ENCOUNTER — Encounter: Payer: Self-pay | Admitting: Internal Medicine

## 2018-01-01 ENCOUNTER — Non-Acute Institutional Stay: Payer: Medicare Other | Admitting: Internal Medicine

## 2018-01-01 VITALS — BP 120/70 | HR 67 | Temp 98.3°F | Wt 127.0 lb

## 2018-01-01 DIAGNOSIS — Z87898 Personal history of other specified conditions: Secondary | ICD-10-CM | POA: Diagnosis not present

## 2018-01-01 DIAGNOSIS — N3281 Overactive bladder: Secondary | ICD-10-CM

## 2018-01-01 DIAGNOSIS — R1310 Dysphagia, unspecified: Secondary | ICD-10-CM

## 2018-01-01 DIAGNOSIS — I2510 Atherosclerotic heart disease of native coronary artery without angina pectoris: Secondary | ICD-10-CM

## 2018-01-01 DIAGNOSIS — Z23 Encounter for immunization: Secondary | ICD-10-CM | POA: Diagnosis not present

## 2018-01-01 DIAGNOSIS — F0390 Unspecified dementia without behavioral disturbance: Secondary | ICD-10-CM | POA: Insufficient documentation

## 2018-01-01 DIAGNOSIS — Z87440 Personal history of urinary (tract) infections: Secondary | ICD-10-CM | POA: Diagnosis not present

## 2018-01-01 DIAGNOSIS — R5381 Other malaise: Secondary | ICD-10-CM

## 2018-01-01 DIAGNOSIS — I10 Essential (primary) hypertension: Secondary | ICD-10-CM | POA: Diagnosis not present

## 2018-01-01 DIAGNOSIS — G3184 Mild cognitive impairment, so stated: Secondary | ICD-10-CM

## 2018-01-01 HISTORY — DX: Unspecified dementia, unspecified severity, without behavioral disturbance, psychotic disturbance, mood disturbance, and anxiety: F03.90

## 2018-01-01 HISTORY — DX: Dysphagia, unspecified: R13.10

## 2018-01-01 NOTE — Progress Notes (Signed)
Provider:  Gwenith Spitz. Renato Gails, D.O., C.M.D. Location:  Medical illustrator of Service:  Clinic (12)  Previous PCP: Merlene Laughter, MD Patient Care Team: Merlene Laughter, MD as PCP - General (Internal Medicine)  Extended Emergency Contact Information Primary Emergency Contact: Wain,Dan Address: [address]          [place], [place] [address] [place] of [place] Phone: [phone] Relation: Grandson Secondary Emergency Contact: Catlynn, [place] Mobile Phone: [phone] Relation: Other  Code Status: DNR; discussed today for 16 minutes with pt and her granddaughter in law. Pt is very clear that it she dies, she does not want CPR. She says she does not understand why people want that interfered with "at her age".  She is ready to pass on if the time comes and does not want to be put on machines.  New [place] DNR form completed.  Living will and HCPOA were in paper chart and will be scanned in [facility] media, also  Goals of Care: Advanced Directive information Advanced Directives 01/01/2018  Does Patient Have a Medical Advance Directive? Yes  Type of Advance Directive Healthcare Power of Attorney  Does patient want to make changes to medical advance directive? No - Patient declined  Copy of Healthcare Power of Attorney in Chart? Yes   Chief Complaint  Patient presents with  . Establish Care    new patient    HPI: Patient is a 84 y.o. female seen today to establish with [facility].  Records have been requested from Dr. Pete Glatter and there is just one note from March on file in her paper chart. Pt is now living in [place] here at [facility] for 2 weeks.  Says her grandson had arranged for her move.  She was born in [place], lived in [place] for 30 years.  She moved here in March of last year.  She first moved to [facility] and then here. She'd been living alone and was lonesome in [place].  Didn't like the cold when she first moved.  Has made a lot  of friends.  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   [place] had [place] [facility]:  Had pneumonia and a few UTIs which took a toll on her physical function.    HTN:On norvasc 5mg  daily, atenolol 25mg  bid.  No issues  Macular degeneration:  On ocuvite-lutein vitamins.  She does not read much anymore.  Saw Dr. Randon Goldsmith late last year and has new lenses.  May begin to see Dr. Charlotte Sanes here so she does not have to leave the building.  Myocardial infarction/prior angioplasty with stent 1993:  On asa, bp meds.  89 so not on statin therapy.  No chest pain or sob since.  Says she refused cholesterol pills in the past.  She says she was told she was naughty not to go on them.    Urinary incontinence:  On ditropan 5mg  po bid.  Says the medicine does not help her as much.  She is taken to the bathroom at night, but does not always go.  Only has incontinence at night.  Can usually hold it in the daytime.  There's a little leakage w/o warning, too.    Constipation:  Probably from ditropan.  On senokot bid and colace daily.  Has a BM about every other morning.  Would like to go daily.  Appetite is not great.  Lost her appetite when she moved.  Did lose  weight when she was hospitalized for pneumonia.    Says she was not doing real good getting around.  She uses a walker in her apt.  She slid out of bed when she got here.  She has an alarm necklace now.    Big pills are hard to swallow.  No difficulty swallowing food most of the time unless it's too big.   She is getting PT, OT.   Has a little hearing loss.  Doesn't want hearing aids.    Has some dental implants but other teeth are hers.    She does not hurt. No pain.    She played golf until 84 yo.    Had shingles on left lower abdomen and is interested in shingrix series.  Past Medical History:  Diagnosis Date  . Hypertension   . Macular degeneration   . Myocardial infarct (HCC)   . Urinary incontinence   . Venous stasis      Past Surgical History:  Procedure Laterality Date  . CORONARY ANGIOPLASTY WITH STENT PLACEMENT      Social History   Socioeconomic History  . Marital status: Widowed    Spouse name: None  . Number of children: None  . Years of education: None  . Highest education level: None  Social Needs  . Financial resource strain: None  . Food insecurity - worry: None  . Food insecurity - inability: None  . Transportation needs - medical: None  . Transportation needs - non-medical: None  Occupational History  . None  Tobacco Use  . Smoking status: Former Games developer  . Smokeless tobacco: Never Used  Substance and Sexual Activity  . Alcohol use: No  . Drug use: No  . Sexual activity: None  Other Topics Concern  . None  Social History Narrative  . None    reports that she has quit smoking. she has never used smokeless tobacco. She reports that she does not drink alcohol or use drugs.  Functional Status Survey:  needs some assistance currently with bathing, dressing, toileting, but able to participate in her care, uses walker and wheelchair  No family history on file.  Health Maintenance  Topic Date Due  . TETANUS/TDAP  09/10/1941  . DEXA SCAN  09/11/1987  . PNA vac Low Risk Adult (1 of 2 - PCV13) 09/11/1987  . INFLUENZA VACCINE  07/31/2017    Allergies  Allergen Reactions  . Erythromycin   . Penicillins     Outpatient Encounter Medications as of 01/01/2018  Medication Sig  . amLODipine (NORVASC) 5 MG tablet Take 5 mg by mouth daily.  Marland Kitchen aspirin EC 81 MG tablet Take 81 mg by mouth every 3 (three) days.  Marland Kitchen atenolol (TENORMIN) 25 MG tablet Take by mouth 2 (two) times daily.  Marland Kitchen docusate sodium (COLACE) 100 MG capsule Take 100 mg by mouth daily.  . Multiple Vitamins-Minerals (CENTRUM SILVER ULTRA WOMENS) TABS Take 1 tablet by mouth daily.  . multivitamin-lutein (OCUVITE-LUTEIN) CAPS capsule Take 1 capsule by mouth daily.  Marland Kitchen oxybutynin (DITROPAN) 5 MG tablet Take 5 mg by mouth 2  (two) times daily.  Marland Kitchen senna (SENOKOT) 8.6 MG TABS tablet Take 1 tablet by mouth 2 (two) times daily.  . [DISCONTINUED] feeding supplement, ENSURE ENLIVE, (ENSURE ENLIVE) LIQD Take 237 mLs by mouth 2 (two) times daily between meals.  . [DISCONTINUED] Ipratropium-Albuterol (COMBIVENT RESPIMAT) 20-100 MCG/ACT AERS respimat Inhale 1 puff into the lungs every 6 (six) hours as needed for wheezing or shortness of breath.  . [  DISCONTINUED] Multiple Vitamin (MULTIVITAMIN WITH MINERALS) TABS tablet Take 1 tablet by mouth daily.  . [DISCONTINUED] pantoprazole (PROTONIX) 20 MG tablet Take 1 tablet (20 mg total) by mouth daily at 12 noon.   No facility-administered encounter medications on file as of 01/01/2018.     Review of Systems  Constitutional: Negative for chills, fever and malaise/fatigue.  HENT: Positive for hearing loss. Negative for congestion.        Mild, gets ear wax  Eyes: Positive for blurred vision.  Respiratory: Negative for cough and shortness of breath.   Cardiovascular: Negative for chest pain, palpitations and leg swelling.  Gastrointestinal: Positive for constipation. Negative for abdominal pain, blood in stool, diarrhea, heartburn, melena, nausea and vomiting.  Genitourinary: Positive for frequency and urgency. Negative for dysuria, flank pain and hematuria.  Musculoskeletal: Positive for falls. Negative for back pain, joint pain, myalgias and neck pain.       One fall since move (tried to get out of bed on her own)  Skin: Negative for itching and rash.  Neurological: Positive for weakness. Negative for dizziness and loss of consciousness.  Endo/Heme/Allergies: Bruises/bleeds easily.  Psychiatric/Behavioral: Positive for memory loss. Negative for depression. The patient is not nervous/anxious and does not have insomnia.        MCI    Vitals:   01/01/18 1017  BP: 120/70  Pulse: 67  Temp: 98.3 F (36.8 C)  TempSrc: Oral  SpO2: 97%  Weight: 127 lb (57.6 kg)   Body mass  index is 23.23 kg/m. Physical Exam  Constitutional: She is oriented to person, place, and time. No distress.  Frail-appearing physically, quite spunky mentally, seated in transport chair   HENT:  Head: Normocephalic and atraumatic.  Right Ear: External ear normal.  Left Ear: External ear normal.  Nose: Nose normal.  Mouth/Throat: Oropharynx is clear and moist. No oropharyngeal exudate.  Right cerumen impaction  Eyes: Conjunctivae and EOM are normal. Pupils are equal, round, and reactive to light.  glasses  Neck: Normal range of motion. Neck supple. No JVD present.  Cardiovascular: Normal rate, regular rhythm, normal heart sounds and intact distal pulses.  Pulmonary/Chest: Effort normal and breath sounds normal. No respiratory distress.  Abdominal: Soft. Bowel sounds are normal. She exhibits no distension. There is no tenderness.  Musculoskeletal: Normal range of motion. She exhibits no tenderness or deformity.  Lymphadenopathy:    She has no cervical adenopathy.  Neurological: She is alert and oriented to person, place, and time.  Skin: Skin is warm and dry. Capillary refill takes less than 2 seconds.  Hyperpigmentation of lower legs from venous stasis  Psychiatric: She has a normal mood and affect. Her behavior is normal. Judgment and thought content normal.    Labs reviewed: Basic Metabolic Panel: Recent Labs    05/30/17 1032 05/31/17 0605 06/02/17 0611  NA 130* 130* 133*  K 3.6 3.5 3.6  CL 102 101 103  CO2 19* 20* 25  GLUCOSE 162* 108* 100*  BUN 10 10 12   CREATININE 0.70 0.56 0.61  CALCIUM 8.4* 8.8* 9.1  MG  --  1.7  --   PHOS  --  1.8*  --    Liver Function Tests: Recent Labs    05/27/17 2134 05/28/17 2236  AST 27 28  ALT 17 20  ALKPHOS 82 80  BILITOT 0.5 0.5  PROT 7.3 6.7  ALBUMIN 3.9 3.3*   Recent Labs    05/29/17 0631  LIPASE 25   No results for input(s): AMMONIA  in the last 8760 hours. CBC: Recent Labs    05/27/17 2134 05/28/17 2235  05/30/17 1032 05/31/17 0605 06/02/17 0611  WBC 11.1* 14.3* 14.1* 13.8* 11.6*  NEUTROABS 9.0* 11.9*  --  11.4*  --   HGB 14.3 13.0 11.5* 12.3 12.0  HCT 41.2 37.2 32.9* 34.5* 34.7*  MCV 92.4 90.5 89.9 88.0 89.0  PLT 180 181 208 247 332   Need medical records from [facility] for immunizations.  She's unsure if she's had her pneumovax or prevnar.  She is agreeable to zostavax if her grandson agrees she should have it (granddaughter in law to check with him).    Assessment/Plan 1. Physical deconditioning -due to aspiration pneumonia and UTIs, decreasing appetite, aging process causing frailty -cont PT, OT, and assisted living support  2. Essential hypertension -bp at goal, cont same regimen  3. Coronary artery disease involving native coronary artery of native heart without angina pectoris -s/p stent in '93, no problems since, cont baby asa and bp meds  4. History of delirium -resolved, but notable  5. Mild cognitive impairment with memory loss -unclear how much delirium remained when MMSE done with score 25/30 and failed clock--seems better than that during my interactions with her--also pentagons may be due in part to her poor vision  6. Overactive bladder -d/c ditropan, start myrbetriq 25mg  po daily which is less anticholinergic so should not worsen constipation, ditropan not helpful anymore anyway  7. History of UTI -notable, would not recheck urine as pt asymptomatic (had been moving slowly with change in mental status per granddaughter in law)  8. Dysphagia, unspecified type -with large pills and large pieces of food, monitor, if notes problems may involve ST also  9. Need for shingles vaccine -to get clearance from grandson, pt wants it  16 mins spent on advance care planning with pt and her granddaughter in law.  60 mins spent on H&P.   Labs/tests ordered: no new  05/07/2018 med mgt  Antha Niday L. Leanndra Pember, D.O. Geriatrics [facility] [address], [place] [address] Cell Phone (Mon-Fri 8am-5pm):  [phone] On Call:  [phone] & follow prompts after 5pm & weekends Office Phone:  [phone] Office Fax:  [phone]

## 2018-02-18 ENCOUNTER — Non-Acute Institutional Stay: Payer: Medicare Other

## 2018-02-18 DIAGNOSIS — Z0000 Encounter for general adult medical examination without abnormal findings: Secondary | ICD-10-CM | POA: Diagnosis not present

## 2018-02-18 NOTE — Progress Notes (Signed)
Subjective:   Sara Clark is a 84 y.o. female who presents for an Initial Medicare Annual Wellness Visit at [facility]       Objective:    Today's Vitals   02/18/18 0950  BP: 120/60  Pulse: (!) 58  Temp: (!) 97.4 F (36.3 C)  TempSrc: Oral  SpO2: 98%  Weight: 127 lb (57.6 kg)  Height: 5\' 2"  (1.575 m)   Body mass index is 23.23 kg/m.  Advanced Directives 02/18/2018 01/01/2018 05/29/2017 05/28/2017 05/27/2017  Does Patient Have a Medical Advance Directive? Yes Yes Yes Yes Yes  Type of Estate agent of [place];Living will;Out of facility DNR (pink MOST or yellow form) Healthcare Power of [employer] Power of [employer] Power of [employer] Power of Attorney  Does patient want to make changes to medical advance directive? No - Patient declined No - Patient declined No - Patient declined - -  Copy of Healthcare Power of Attorney in Chart? Yes Yes No - copy requested - -  Pre-existing out of facility DNR order (yellow form or pink MOST form) Yellow form placed in chart (order not valid for inpatient use) - - - -    Current Medications (verified) Outpatient Encounter Medications as of 02/18/2018  Medication Sig  . amLODipine (NORVASC) 5 MG tablet Take 5 mg by mouth daily.  Marland Kitchen aspirin EC 81 MG tablet Take 81 mg by mouth every 3 (three) days.  Marland Kitchen atenolol (TENORMIN) 25 MG tablet Take by mouth 2 (two) times daily.  Marland Kitchen docusate sodium (COLACE) 100 MG capsule Take 100 mg by mouth daily.  . Multiple Vitamins-Minerals (CENTRUM SILVER ULTRA WOMENS) TABS Take 1 tablet by mouth daily.  . multivitamin-lutein (OCUVITE-LUTEIN) CAPS capsule Take 1 capsule by mouth daily.  Marland Kitchen oxybutynin (DITROPAN) 5 MG tablet Take 5 mg by mouth 2 (two) times daily.  Marland Kitchen senna (SENOKOT) 8.6 MG TABS tablet Take 1 tablet by mouth 2 (two) times daily.   No facility-administered encounter medications on file as of 02/18/2018.     Allergies  (verified) Erythromycin and Penicillins   History: Past Medical History:  Diagnosis Date  . Hypertension   . Macular degeneration   . Myocardial infarct (HCC)   . Urinary incontinence   . Venous stasis    Past Surgical History:  Procedure Laterality Date  . CORONARY ANGIOPLASTY WITH STENT PLACEMENT     No family history on file. Social History   Socioeconomic History  . Marital status: Widowed    Spouse name: None  . Number of children: None  . Years of education: None  . Highest education level: None  Social Needs  . Financial resource strain: Not hard at all  . Food insecurity - worry: Never true  . Food insecurity - inability: Never true  . Transportation needs - medical: No  . Transportation needs - non-medical: No  Occupational History  . None  Tobacco Use  . Smoking status: Former Games developer  . Smokeless tobacco: Never Used  Substance and Sexual Activity  . Alcohol use: No  . Drug use: No  . Sexual activity: None  Other Topics Concern  . None  Social History Narrative  . None    Tobacco Counseling Counseling given: Not Answered   Clinical Intake:  Pre-visit preparation completed: No  Pain : No/denies pain     Nutritional Risks: None Diabetes: No  How often do you need to have someone help you when you read instructions, pamphlets, or other  written materials from your doctor or pharmacy?: 1 - Never What is the last grade level you completed in school?: 11th  Interpreter Needed?: No  Information entered by :: Tyron RussellSara Sharone Picchi, RN   Activities of Daily Living In your present state of health, do you have any difficulty performing the following activities: 02/18/2018 05/29/2017  Hearing? N N  Vision? N N  Difficulty concentrating or making decisions? Y N  Walking or climbing stairs? Y N  Dressing or bathing? Y N  Doing errands, shopping? Malvin JohnsY Y  Preparing Food and eating ? Y -  Using the Toilet? N -  In the past six months, have you accidently  leaked urine? Y -  Do you have problems with loss of bowel control? N -  Managing your Medications? Y -  Managing your Finances? Y -  Housekeeping or managing your Housekeeping? Y -     Immunizations and Health Maintenance Immunization History  Administered Date(s) Administered  . Influenza-Unspecified 09/30/2017   Health Maintenance Due  Topic Date Due  . TETANUS/TDAP  09/10/1939  . DEXA SCAN  09/11/1987  . PNA vac Low Risk Adult (1 of 2 - PCV13) 09/11/1987    Patient Care Team: Kermit Baloeed, Tiffany L, DO as PCP - General (Geriatric Medicine)  Indicate any recent Medical Services you may have received from other than [facility] providers in the past year (date may be approximate).     Assessment:   This is a routine wellness examination for [name].  Hearing/Vision screen Hearing Screening Comments: Pt reports no issues with hearing Vision Screening Comments: Sees eye doctor annually  Dietary issues and exercise activities discussed: Current Exercise Habits: Home exercise routine, Type of exercise: stretching, Time (Minutes): 10, Frequency (Times/Week): 7, Weekly Exercise (Minutes/Week): 70, Intensity: Mild, Exercise limited by: orthopedic condition(s)  Goals    None     Depression Screen PHQ 2/9 Scores 02/18/2018 01/01/2018  PHQ - 2 Score 0 0    Fall Risk Fall Risk  02/18/2018 01/01/2018  Falls in the past year? Yes No  Number falls in past yr: 1 -  Injury with Fall? No -    Is the patient's home free of loose throw rugs in walkways, pet beds, electrical cords, etc?   yes      Grab bars in the bathroom? yes      Handrails on the stairs?   yes      Adequate lighting?   yes  Timed Get Up and Go Performed 28 seconds, fall risk  Cognitive Function: MMSE - Mini Mental State Exam 12/12/2017  Orientation to time 4  Orientation to Place 5  Registration 3  Attention/ Calculation 3  Recall 2  Language- name 2 objects 2  Language- repeat 1  Language- follow 3 step command 3   Language- read & follow direction 1  Write a sentence 1  Copy design 0  Total score 25        Screening Tests Health Maintenance  Topic Date Due  . TETANUS/TDAP  09/10/1939  . DEXA SCAN  09/11/1987  . PNA vac Low Risk Adult (1 of 2 - PCV13) 09/11/1987  . INFLUENZA VACCINE  Completed    Qualifies for Shingles Vaccine? No recorded hx of shingles vaccine  Cancer Screenings: Lung: Low Dose CT Chest recommended if Age 55-80 years, 30 pack-year currently smoking OR have quit w/in 15years. Patient does not qualify. Breast: Up to date on Mammogram? Yes   Up to date of Bone Density/Dexa? Yes Colorectal:  up to date  Additional Screenings:  Hepatitis B/HIV/Syphillis: not indicated Hepatitis C Screening: declined     Plan:    I have personally reviewed and addressed the Medicare Annual Wellness questionnaire and have noted the following in the patient's chart:  A. Medical and social history B. Use of alcohol, tobacco or illicit drugs  C. Current medications and supplements D. Functional ability and status E.  Nutritional status F.  Physical activity G. Advance directives H. List of other physicians I.  Hospitalizations, surgeries, and ER visits in previous 12 months J.  Vitals K. Screenings to include hearing, vision, cognitive, depression L. Referrals and appointments - none  In addition, I have reviewed and discussed with patient certain preventive protocols, quality metrics, and best practice recommendations. A written personalized care plan for preventive services as well as general preventive health recommendations were provided to patient.  See attached scanned questionnaire for additional information.   Signed,   Tyron Russell, RN Nurse Health Advisor  Patient Concerns: None

## 2018-02-26 ENCOUNTER — Other Ambulatory Visit: Payer: Self-pay | Admitting: Internal Medicine

## 2018-04-04 LAB — BASIC METABOLIC PANEL
BUN: 22 — AB (ref 4–21)
Creatinine: 1.1 (ref 0.5–1.1)
Glucose: 77
Potassium: 4.9 (ref 3.4–5.3)
Sodium: 137 (ref 137–147)

## 2018-04-04 LAB — CBC AND DIFFERENTIAL
HCT: 39 (ref 36–46)
Hemoglobin: 13.1 (ref 12.0–16.0)
Platelets: 189 (ref 150–399)
WBC: 6.3

## 2018-04-08 ENCOUNTER — Encounter: Payer: Self-pay | Admitting: Internal Medicine

## 2018-04-09 ENCOUNTER — Non-Acute Institutional Stay: Payer: Medicare Other | Admitting: Internal Medicine

## 2018-04-09 ENCOUNTER — Encounter: Payer: Self-pay | Admitting: Internal Medicine

## 2018-04-09 VITALS — BP 130/62 | HR 57 | Temp 98.4°F | Ht 62.0 in | Wt 134.0 lb

## 2018-04-09 DIAGNOSIS — N3281 Overactive bladder: Secondary | ICD-10-CM | POA: Diagnosis not present

## 2018-04-09 DIAGNOSIS — F0390 Unspecified dementia without behavioral disturbance: Secondary | ICD-10-CM | POA: Diagnosis not present

## 2018-04-09 DIAGNOSIS — R54 Age-related physical debility: Secondary | ICD-10-CM | POA: Insufficient documentation

## 2018-04-09 DIAGNOSIS — K5901 Slow transit constipation: Secondary | ICD-10-CM | POA: Insufficient documentation

## 2018-04-09 DIAGNOSIS — R5381 Other malaise: Secondary | ICD-10-CM

## 2018-04-09 DIAGNOSIS — R1310 Dysphagia, unspecified: Secondary | ICD-10-CM

## 2018-04-09 DIAGNOSIS — I2510 Atherosclerotic heart disease of native coronary artery without angina pectoris: Secondary | ICD-10-CM | POA: Diagnosis not present

## 2018-04-09 NOTE — Progress Notes (Signed)
[facility]   Place of Service:  Clinic (12)  Provider: Ercell Perlman L. Renato Gailseed, D.O., C.M.D.  Code Status: DNR in chart as reviewed at last visit Goals of Care:  Advanced Directives 04/09/2018  Does Patient Have a Medical Advance Directive? Yes  Type of Estate agentAdvance Directive Healthcare Power of [place];Living will;Out of facility DNR (pink MOST or yellow form)  Does patient want to make changes to medical advance directive? No - Patient declined  Copy of Healthcare Power of Attorney in Chart? Yes  Pre-existing out of facility DNR order (yellow form or pink MOST form) Yellow form placed in chart (order not valid for inpatient use)    Chief Complaint  Patient presents with  . Acute Visit    change in condition per assisted living    HPI: Patient is a 84 y.o. female seen today for medical management of chronic diseases.  Enhanced assisted living staff have noted that she is increasingly dependent in adls, her mobility has declined.  She is accompanied by a new CNA working with her who is not as familiar with her care.  DON had given me a copy of the last level of care assessment tool that showed that she is dependent at a rating of 65.  She's requiring increased cuing, increased incontinence, and difficulty with hiding her meds (must be supervised to swallow them).  She has been treated for a UTI and completed her abx with bactrim on 4/1.  DON thought she might be in need of memory care.  She definitely needs skilled care.    CNA notes that her walking is worse--they brought in her a manual wheelchair today.  She also points out that her stools are hard.  She's getting only one senna tablet daily.  She does not drink water well.    Past Medical History:  Diagnosis Date  . Hypertension   . Macular degeneration   . Myocardial infarct (HCC)   . Urinary incontinence   . Venous stasis     Past Surgical History:  Procedure Laterality Date  . CORONARY ANGIOPLASTY  WITH STENT PLACEMENT      Allergies  Allergen Reactions  . Erythromycin   . Penicillins     Outpatient Encounter Medications as of 04/09/2018  Medication Sig  . amLODipine (NORVASC) 5 MG tablet Take 5 mg by mouth daily.  Marland Kitchen. antiseptic oral rinse (BIOTENE) LIQD 15 mLs by Mouth Rinse route 2 (two) times daily.  Marland Kitchen. aspirin EC 81 MG tablet Take 81 mg by mouth every 3 (three) days.  Marland Kitchen. atenolol (TENORMIN) 25 MG tablet Take by mouth 2 (two) times daily.  Marland Kitchen. docusate sodium (COLACE) 100 MG capsule Take 100 mg by mouth daily.  . mirabegron ER (MYRBETRIQ) 25 MG TB24 tablet Take 25 mg by mouth daily.  . Multiple Vitamins-Minerals (CENTRUM SILVER ULTRA WOMENS) TABS Take 1 tablet by mouth daily.  . multivitamin-lutein (OCUVITE-LUTEIN) CAPS capsule Take 1 capsule by mouth daily.  Marland Kitchen. senna (SENOKOT) 8.6 MG TABS tablet Take 1 tablet by mouth 2 (two) times daily.   No facility-administered encounter medications on file as of 04/09/2018.     Review of Systems:  Review of Systems  Constitutional: Negative for chills, fever and malaise/fatigue.  HENT: Positive for hearing loss.   Eyes: Negative for blurred vision.       Glasses  Respiratory: Negative for cough and shortness of breath.   Cardiovascular: Negative for chest pain and palpitations.  Gastrointestinal: Positive for constipation.  Genitourinary: Positive for frequency and urgency. Negative for dysuria, flank pain and hematuria.       Incontinence  Musculoskeletal: Negative for joint pain.  Neurological:       Tenderness of legs   Endo/Heme/Allergies: Bruises/bleeds easily.  Psychiatric/Behavioral: Positive for memory loss.    Health Maintenance  Topic Date Due  . TETANUS/TDAP  09/10/1939  . DEXA SCAN  09/11/1987  . PNA vac Low Risk Adult (1 of 2 - PCV13) 09/11/1987  . INFLUENZA VACCINE  07/31/2018    Physical Exam: Vitals:   04/09/18 1425  BP: 130/62  Pulse: (!) 57  Temp: 98.4 F (36.9 C)  TempSrc: Oral  SpO2: 95%  Weight:  134 lb (60.8 kg)  Height: 5\' 2"  (1.575 m)   Body mass index is 24.51 kg/m. Physical Exam  Constitutional: No distress.  Frail white female resting in manual wheelchair  HENT:  Head: Normocephalic and atraumatic.  Eyes:  glasses  Cardiovascular: Normal rate, regular rhythm and intact distal pulses.  Pulmonary/Chest: Effort normal and breath sounds normal. No respiratory distress.  Abdominal: Soft. Bowel sounds are normal. She exhibits no distension. There is no tenderness.  Musculoskeletal: Normal range of motion.  Neurological: She is alert.  Poor historian, denies everything except admitting to hard stools  Skin: Skin is warm and dry.  Thin skin with ecchymoses on lower legs  Psychiatric:  Fixated on a purple spot on her multicolored pants and kept talking about how it was going to pop any minute    Labs reviewed: Basic Metabolic Panel: Recent Labs    05/30/17 1032 05/31/17 0605 06/02/17 0611 04/04/18 0600  NA 130* 130* 133* 137  K 3.6 3.5 3.6 4.9  CL 102 101 103  --   CO2 19* 20* 25  --   GLUCOSE 162* 108* 100*  --   BUN 10 10 12  22*  CREATININE 0.70 0.56 0.61 1.1  CALCIUM 8.4* 8.8* 9.1  --   MG  --  1.7  --   --   PHOS  --  1.8*  --   --    Liver Function Tests: Recent Labs    05/27/17 2134 05/28/17 2236  AST 27 28  ALT 17 20  ALKPHOS 82 80  BILITOT 0.5 0.5  PROT 7.3 6.7  ALBUMIN 3.9 3.3*   Recent Labs    05/29/17 0631  LIPASE 25   No results for input(s): AMMONIA in the last 8760 hours. CBC: Recent Labs    05/27/17 2134 05/28/17 2235 05/30/17 1032 05/31/17 0605 06/02/17 0611 04/04/18 0600  WBC 11.1* 14.3* 14.1* 13.8* 11.6* 6.3  NEUTROABS 9.0* 11.9*  --  11.4*  --   --   HGB 14.3 13.0 11.5* 12.3 12.0 13.1  HCT 41.2 37.2 32.9* 34.5* 34.7* 39  MCV 92.4 90.5 89.9 88.0 89.0  --   PLT 180 181 208 247 332 189    Assessment/Plan 1. Dementia without behavioral disturbance, unspecified dementia type -progressive with increasing adl needs -was  ruled out for UTI and culture did not grow any bacteria  2. Overactive bladder -on mybetriq but unclear how helpful this is, still incontinent often which may be more functional at this point  3. Dysphagia, unspecified type -ongoing, getting increased supervision in enhanced AL, but seems to require SNF or memory care SNF at this point  4. Physical deconditioning -progressive, came in wheelchair due to weakness and unsteadiness when walking with walker, needing a lot of cues and reminders  5. Frailty syndrome  in geriatric patient -worsening, needing more ADL help and an increased level of care  6. Slow transit constipation -increase senna s to two per day -encourage water intake  Labs/tests ordered: no new Next appt:  05/07/2018  Canton Yearby L. Ronnald Shedden, D.O. Geriatrics [facility] [address], [place] [address] Cell Phone (Mon-Fri 8am-5pm):  [phone] On Call:  [phone] & follow prompts after 5pm & weekends Office Phone:  [phone] Office Fax:  [phone]

## 2018-04-25 LAB — TSH: TSH: 3.62 (ref 0.41–5.90)

## 2018-05-07 ENCOUNTER — Encounter: Payer: Self-pay | Admitting: Internal Medicine

## 2018-05-13 ENCOUNTER — Non-Acute Institutional Stay (SKILLED_NURSING_FACILITY): Payer: Medicare Other | Admitting: Internal Medicine

## 2018-05-13 ENCOUNTER — Encounter: Payer: Self-pay | Admitting: Internal Medicine

## 2018-05-13 DIAGNOSIS — I2510 Atherosclerotic heart disease of native coronary artery without angina pectoris: Secondary | ICD-10-CM

## 2018-05-13 DIAGNOSIS — R1310 Dysphagia, unspecified: Secondary | ICD-10-CM

## 2018-05-13 DIAGNOSIS — R5381 Other malaise: Secondary | ICD-10-CM

## 2018-05-13 DIAGNOSIS — N3281 Overactive bladder: Secondary | ICD-10-CM | POA: Diagnosis not present

## 2018-05-13 DIAGNOSIS — F0150 Vascular dementia without behavioral disturbance: Secondary | ICD-10-CM | POA: Diagnosis not present

## 2018-05-13 DIAGNOSIS — I10 Essential (primary) hypertension: Secondary | ICD-10-CM

## 2018-05-13 DIAGNOSIS — K5901 Slow transit constipation: Secondary | ICD-10-CM | POA: Diagnosis not present

## 2018-05-13 DIAGNOSIS — R54 Age-related physical debility: Secondary | ICD-10-CM

## 2018-05-13 NOTE — Progress Notes (Signed)
Patient ID: Sara Clark, female   DOB: [birth year], 84 y.o.   MRN: [id number]  Provider:  Gwenith Spitz. Renato Gails, D.O., C.M.D. Location:  [facility] Room Number: 105 Place of Service:  SNF (31)  PCP: Kermit Balo, DO Patient Care Team: Kermit Balo, DO as PCP - General (Geriatric Medicine)  Extended Emergency Contact Information Primary Emergency Contact: Amy,Dan Address: [address]          [place], [place] [address] [place] of [place] Phone: [phone] Relation: Grandson Secondary Emergency Contact: Vonya, [place] Mobile Phone: [phone] Relation: Other  Code Status: DNR Goals of Care: Advanced Directive information Advanced Directives 05/13/2018  Does Patient Have a Medical Advance Directive? Yes  Type of Advance Directive Out of facility DNR (pink MOST or yellow form);Healthcare Power of Attorney  Does patient want to make changes to medical advance directive? No - Patient declined  Copy of Healthcare Power of Attorney in Chart? Yes  Pre-existing out of facility DNR order (yellow form or pink MOST form) Yellow form placed in chart (order not valid for inpatient use)  Grandson, a radiologist, is her HCPOA and primary contact   Chief Complaint  Patient presents with  . New Admit To SNF    moved from AL to SNF    HPI: Patient is a 84 y.o. female with dementia, h/o CAD, htn, gerd, dysphagia, constipation, OAB with incontinence, frailty, and deconditioning seen today for admission to SNF from AL enhanced due to functional decline and progressive dementia.  Apparently she played golf until age 54.  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  See my clinic note from last visit 4/10 for more details and new pt to establish visit 01/01/18 (had moved from [place] to [place] and then to enhanced [place]).  She is requiring assistance with all ADLs at this point.  She's been resistant to participating in activities or leaving her room since her  move.  She has made a pretty rapid decline since arrival with multiple changes in location, staff, level of care.  She's been tested numerous times for UTI and treated when legitimate, but mostly has been colonized with bacteria.  Had delirium during may hospitalization last year.  MMSE 25/30 failing clock at AL enhanced admission (vision poor with macular also).  She remains on myrbetriq for overactive bladder.  Previously on ditropan which I stopped and changed to myrbetriq due to anticholinergic effects. Was initially only incontinent at night, but that's progressed. Now unclear if true benefit with progressive incontinence.  After she's been in snf a little longer, I'll get a better picture of this status and perhaps, mybetriq should be stopped.  CAD:  Prior MI with angioplasty and stent in '93.  No trouble since.  BP at goal.  She remains on amlodipine, atenolol and asa.  Not on statin given age and dementia and previous refusal per records.    Constipation:  Moving bowels with senokot bid and off ditropan  Dysphagia:  At admission, had trouble swallowing large food and pills.    HOH, but refused hearing aids.    Past Medical History:  Diagnosis Date  . Hypertension   . Macular degeneration   . Myocardial infarct (HCC)   . Urinary incontinence   . Venous stasis    Past Surgical History:  Procedure Laterality Date  . CORONARY ANGIOPLASTY WITH STENT PLACEMENT      reports that she has quit smoking. She has never used smokeless tobacco. She reports that  she does not drink alcohol or use drugs. Social History   Socioeconomic History  . Marital status: Widowed    Spouse name: Not on file  . Number of children: Not on file  . Years of education: Not on file  . Highest education level: Not on file  Occupational History  . Not on file  Social Needs  . Financial resource strain: Not hard at all  . Food insecurity:    Worry: Never true    Inability: Never true  . Transportation  needs:    Medical: No    Non-medical: No  Tobacco Use  . Smoking status: Former Games developer  . Smokeless tobacco: Never Used  Substance and Sexual Activity  . Alcohol use: No  . Drug use: No  . Sexual activity: Not on file  Lifestyle  . Physical activity:    Days per week: 7 days    Minutes per session: 10 min  . Stress: Not at all  Relationships  . Social connections:    Talks on phone: More than three times a week    Gets together: More than three times a week    Attends religious service: Never    Active member of club or organization: No    Attends meetings of clubs or organizations: Never    Relationship status: Widowed  . Intimate partner violence:    Fear of current or ex partner: No    Emotionally abused: No    Physically abused: No    Forced sexual activity: No  Other Topics Concern  . Not on file  Social History Narrative  . Not on file    Functional Status Survey:    No family history on file.  Health Maintenance  Topic Date Due  . TETANUS/TDAP  09/10/1941  . DEXA SCAN  09/11/1987  . PNA vac Low Risk Adult (1 of 2 - PCV13) 09/11/1987  . INFLUENZA VACCINE  07/31/2018    Allergies  Allergen Reactions  . Erythromycin   . Penicillins     Outpatient Encounter Medications as of 05/13/2018  Medication Sig  . amLODipine (NORVASC) 5 MG tablet Take 5 mg by mouth daily.  Marland Kitchen antiseptic oral rinse (BIOTENE) LIQD 15 mLs by Mouth Rinse route 2 (two) times daily.  Marland Kitchen aspirin EC 81 MG tablet Take 81 mg by mouth every 3 (three) days.  Marland Kitchen atenolol (TENORMIN) 25 MG tablet Take by mouth 2 (two) times daily.  . mirabegron ER (MYRBETRIQ) 25 MG TB24 tablet Take 25 mg by mouth daily.  . Multiple Vitamins-Minerals (CENTRUM SILVER ULTRA WOMENS) TABS Take 1 tablet by mouth daily.  . multivitamin-lutein (OCUVITE-LUTEIN) CAPS capsule Take 1 capsule by mouth daily.  Marland Kitchen senna (SENOKOT) 8.6 MG TABS tablet Take 1 tablet by mouth 2 (two) times daily.  . [DISCONTINUED] docusate sodium  (COLACE) 100 MG capsule Take 100 mg by mouth daily.   No facility-administered encounter medications on file as of 05/13/2018.     Review of Systems  Constitutional: Positive for activity change, appetite change and fatigue. Negative for chills and fever.       Has actually gained weight since move to AL enhanced  HENT: Positive for trouble swallowing. Negative for congestion.   Eyes: Positive for visual disturbance.       Macular degeneration  Respiratory: Negative for apnea, chest tightness and shortness of breath.   Cardiovascular: Negative for chest pain and leg swelling.  Gastrointestinal: Positive for constipation. Negative for abdominal distention, abdominal pain, diarrhea and  nausea.  Genitourinary: Positive for frequency and urgency. Negative for dysuria.       Incontinence, was more OAB, now functional as well  Musculoskeletal: Positive for gait problem. Negative for arthralgias, back pain, joint swelling and myalgias.  Skin: Negative for color change.  Neurological: Positive for weakness. Negative for dizziness.  Hematological: Bruises/bleeds easily.  Psychiatric/Behavioral: Positive for confusion. Negative for hallucinations and sleep disturbance. The patient is not nervous/anxious.        Recently more resistant to care and activity since moving    Vitals:   05/13/18 1024  BP: 122/68  Pulse: 63  Resp: 20  Temp: 98.2 F (36.8 C)  TempSrc: Oral  SpO2: 97%  Weight: 135 lb (61.2 kg)  Height: 5\' 2"  (1.575 m)   Body mass index is 24.69 kg/m. Physical Exam  Constitutional: No distress.  Frail female seated in wheelchair wiggling legs around, reports no concerns, but not wanting to converse today  HENT:  Head: Normocephalic and atraumatic.  Right Ear: External ear normal.  Left Ear: External ear normal.  Nose: Nose normal.  Mouth/Throat: Oropharynx is clear and moist. No oropharyngeal exudate.  glasses  Eyes: Pupils are equal, round, and reactive to light. EOM are  normal.  Neck: Normal range of motion. Neck supple. No JVD present.  Pulmonary/Chest: Effort normal and breath sounds normal. No respiratory distress.  Abdominal: Soft. Bowel sounds are normal. She exhibits no distension. There is no tenderness.  Musculoskeletal: Normal range of motion. She exhibits no tenderness.  Oriented to person  Lymphadenopathy:    She has no cervical adenopathy.  Neurological: She is alert.  Skin: Skin is warm and dry. Capillary refill takes less than 2 seconds. She is not diaphoretic.  Thin dry skin  Psychiatric:  apathetic    Labs reviewed: Basic Metabolic Panel: Recent Labs    05/30/17 1032 05/31/17 0605 06/02/17 0611 04/04/18 0600  NA 130* 130* 133* 137  K 3.6 3.5 3.6 4.9  CL 102 101 103  --   CO2 19* 20* 25  --   GLUCOSE 162* 108* 100*  --   BUN 10 10 12  22*  CREATININE 0.70 0.56 0.61 1.1  CALCIUM 8.4* 8.8* 9.1  --   MG  --  1.7  --   --   PHOS  --  1.8*  --   --    Liver Function Tests: Recent Labs    05/27/17 2134 05/28/17 2236  AST 27 28  ALT 17 20  ALKPHOS 82 80  BILITOT 0.5 0.5  PROT 7.3 6.7  ALBUMIN 3.9 3.3*   Recent Labs    05/29/17 0631  LIPASE 25   No results for input(s): AMMONIA in the last 8760 hours. CBC: Recent Labs    05/27/17 2134 05/28/17 2235 05/30/17 1032 05/31/17 0605 06/02/17 0611 04/04/18 0600  WBC 11.1* 14.3* 14.1* 13.8* 11.6* 6.3  NEUTROABS 9.0* 11.9*  --  11.4*  --   --   HGB 14.3 13.0 11.5* 12.3 12.0 13.1  HCT 41.2 37.2 32.9* 34.5* 34.7* 39  MCV 92.4 90.5 89.9 88.0 89.0  --   PLT 180 181 208 247 332 189   Cardiac Enzymes: No results for input(s): CKTOTAL, CKMB, CKMBINDEX, TROPONINI in the last 8760 hours. BNP: Invalid input(s): POCBNP No results found for: HGBA1C Lab Results  Component Value Date   TSH 3.62 04/25/2018   No results found for: VITAMINB12 No results found for: FOLATE No results found for: IRON, TIBC, FERRITIN  Imaging and  Procedures obtained prior to SNF admission: Dg  Chest 2 View  Result Date: 05/29/2017 CLINICAL DATA:  84 year old female with productive cough for about a week and fever. EXAM: CHEST  2 VIEW COMPARISON:  Chest radiograph dated 05/27/2017 FINDINGS: There is shallow inspiration. There are hazy bibasilar densities, right greater than left which appear more prominent and more confluent compared to the prior radiograph. This may represent atelectatic changes/scarring appearing more prominent secondary to decreased inspiratory effort compared to prior radiograph. However, developing infiltrate is not excluded. Correlation with clinical exam is recommended. There is no pleural effusion or pneumothorax. Probable mild bronchiectatic changes. The cardiac silhouette is within normal limits. Coronary vascular stent noted. There is atherosclerotic calcification of the thoracic aorta. There is osteopenia with degenerative changes of the spine. No acute osseous pathology. IMPRESSION: Hazy bibasilar densities, right greater than left may represent atelectatic changes, although developing infiltrate is not excluded. Correlation with clinical exam recommended. Electronically Signed   By: Elgie Collard M.D.   On: 05/29/2017 00:34   Ct Abdomen Pelvis W Contrast  Result Date: 05/29/2017 CLINICAL DATA:  Abdominal pain. Admitted for urinary tract infection and probable pneumonia. Sepsis clinically. Metabolic encephalopathy. EXAM: CT ABDOMEN AND PELVIS WITH CONTRAST TECHNIQUE: Multidetector CT imaging of the abdomen and pelvis was performed using the standard protocol following bolus administration of intravenous contrast. CONTRAST:  80mL ISOVUE-300 IOPAMIDOL (ISOVUE-300) INJECTION 61% COMPARISON:  Chest radiographs including earlier today. FINDINGS: Lower chest: Motion degradation at the lung bases. Patchy right lower lobe airspace disease. Minimal left lower lobe dependent atelectasis. Probable scarring in the inferior right middle lobe and posterior lingula. The posterior  lingular nodule measures 4 mm on image 19/series 6. Normal heart size with multivessel coronary artery atherosclerosis. Small hiatal hernia with subtle contrast level in the distal esophagus on image 11/series 2. Trace right pleural fluid. Hepatobiliary: Calcification in the liver is likely related to old granulomatous disease. The gallbladder is stone filled, without surrounding inflammation or biliary duct dilatation. Pancreas: Mild fatty replacement involving the pancreatic body, head, and uncinate process. No duct dilatation or dominant mass. Spleen: Old granulomatous disease in the spleen. Adrenals/Urinary Tract: Normal adrenal glands. Partially malrotated right kidney. Too small to characterize lesions in both kidneys. No hydronephrosis. Normal urinary bladder. Stomach/Bowel: Normal remainder of the stomach. Mild mid abdominal motion degradation. Extensive colonic diverticulosis. Wall thickening involving the sigmoid is likely related to muscular hypertrophy. No convincing evidence of surrounding inflammation Normal terminal ileum. Appendix is not visualized but there is no evidence of right lower quadrant inflammation. Normal small bowel. Vascular/Lymphatic: Advanced aortic and branch vessel atherosclerosis. No abdominopelvic adenopathy. Reproductive: Hysterectomy.  No adnexal mass. Other: No significant free fluid. Mild pelvic floor laxity. Tiny fat containing right inguinal hernia. No free intraperitoneal air. Gas in the anterior abdominal wall is eccentric right and likely iatrogenic on image 33/series 2. Musculoskeletal: Mild osteopenia. Lumbosacral spondylosis with likely secondary trace L4-5 anterolisthesis. Multilevel disc bulges. IMPRESSION: 1. Mildly motion degraded exam. 2. Cholelithiasis. No evidence of acute cholecystitis and no other explanation for abdominal pain. 3. Right lower lobe airspace disease is most consistent with either infection or aspiration. Small hiatal hernia with distal  esophageal contrast level suggests dysmotility or gastroesophageal reflux and could predispose the patient to aspiration. 4. Trace right pleural fluid. 5.  Coronary artery atherosclerosis. Aortic atherosclerosis. 6. 4 mm lingular nodule. No follow-up needed if patient is low-risk. Non-contrast chest CT can be considered in 12 months if patient is high-risk. This recommendation follows the  consensus statement: Guidelines for Management of Incidental Pulmonary Nodules Detected on CT Images: From the Fleischner Society 2017; Radiology 2017; 284:228-243. Electronically Signed   By: Jeronimo Greaves M.D.   On: 05/29/2017 12:22    Assessment/Plan 1. Vascular dementia without behavioral disturbance -suspect vascular contribution due to CAD and recent rapid progression, but, may be mixed with AD and also affected by visual and auditory losses --cont supportive care in SNF -has living will, hcpoa, DNR  2. Overactive bladder -progressing and now combined with functional cause -cont myrbetriq until we can get a getter picture from SNF staff about how often she calls and gets to the restroom with assistance vs incontinent (has just moved)  3. Frailty syndrome in geriatric patient -progressing, using wheelchair now, not ambulating with walker, her grandson is supportive  4. Coronary artery disease involving native coronary artery of native heart without angina pectoris -cont asa and bp meds -no active symptoms   5. Essential hypertension -bp at goal, cont amlodipine and atenolol  6. Dysphagia, unspecified type -was needing more supervision with eating in AL enhanced and increased cuing  7. Slow transit constipation -cont senna-s which was increased to bid due to hard stools at last visit  8. Physical deconditioning -ongoing, has had some PT, OT, needs this SNF level of care   Family/ staff Communication:   Discussed with SNF nurse  Labs/tests ordered:  No new  Laquilla Dault L. Deisi Salonga,  D.O. Geriatrics [facility] [address] [place], [place] [address] Cell Phone (Mon-Fri 8am-5pm):  [phone] On Call:  [phone] & follow prompts after 5pm & weekends Office Phone:  [phone] Office Fax:  [phone]

## 2018-06-05 ENCOUNTER — Encounter: Payer: Self-pay | Admitting: Adult Health

## 2018-06-05 ENCOUNTER — Non-Acute Institutional Stay (SKILLED_NURSING_FACILITY): Payer: Medicare Other | Admitting: Adult Health

## 2018-06-05 DIAGNOSIS — R1310 Dysphagia, unspecified: Secondary | ICD-10-CM

## 2018-06-05 DIAGNOSIS — M25572 Pain in left ankle and joints of left foot: Secondary | ICD-10-CM | POA: Diagnosis not present

## 2018-06-05 DIAGNOSIS — K117 Disturbances of salivary secretion: Secondary | ICD-10-CM | POA: Insufficient documentation

## 2018-06-05 DIAGNOSIS — I10 Essential (primary) hypertension: Secondary | ICD-10-CM | POA: Diagnosis not present

## 2018-06-05 DIAGNOSIS — R5381 Other malaise: Secondary | ICD-10-CM

## 2018-06-05 DIAGNOSIS — F0150 Vascular dementia without behavioral disturbance: Secondary | ICD-10-CM

## 2018-06-05 DIAGNOSIS — M2550 Pain in unspecified joint: Secondary | ICD-10-CM | POA: Insufficient documentation

## 2018-06-05 DIAGNOSIS — M25571 Pain in right ankle and joints of right foot: Secondary | ICD-10-CM | POA: Diagnosis not present

## 2018-06-05 DIAGNOSIS — R682 Dry mouth, unspecified: Secondary | ICD-10-CM | POA: Diagnosis not present

## 2018-06-05 NOTE — Assessment & Plan Note (Addendum)
Fairly rapid decline in cognition. Continue supportive care and skilled environment. Continue baby aspirin.

## 2018-06-05 NOTE — Assessment & Plan Note (Addendum)
Controlled. Continue Norvasc 5 mg qd and atenolol 25 mg bid.

## 2018-06-05 NOTE — Assessment & Plan Note (Signed)
She has pain in both knees and both feet. No swelling or redness. ?neuropathy vs arthritic pain. Will schedule tylenol 650 mg BID and q 8 hrs prn. If no improvement we can consider additional management but she sleeps much of the time and I wanted to avoid sedating medication.

## 2018-06-05 NOTE — Assessment & Plan Note (Addendum)
ST to eval and tx as indicated Noted 5 lb weight loss

## 2018-06-05 NOTE — Assessment & Plan Note (Addendum)
She now uses the stand up lift for transfers. She worked with PT but was discharged due to lack of progress.

## 2018-06-05 NOTE — Progress Notes (Signed)
[facility]   Place of Service:  SNF (31) Provider:   Peggye Leyhristy Rayfield Beem, ANP [facility] [phone]   Kermit Baloeed, Tiffany L, DO  Patient Care Team: Kermit Baloeed, Tiffany L, DO as PCP - General (Geriatric Medicine)  Extended Emergency Contact Information Primary Emergency Contact: Yarbough,Dan Address: [address]          [place], [place] [address] [place] Mobile Phone: [phone] Relation: Lucila MaineGrandson Secondary Emergency Contact: Andrey Farmerntrikin, Joann Mobile Phone: [phone] Relation: Other  Code Status:  DNR Goals of care: Advanced Directive information Advanced Directives 05/13/2018  Does Patient Have a Medical Advance Directive? Yes  Type of Advance Directive Out of facility DNR (pink MOST or yellow form);Healthcare Power of Attorney  Does patient want to make changes to medical advance directive? No - Patient declined  Copy of Healthcare Power of Attorney in Chart? Yes  Pre-existing out of facility DNR order (yellow form or pink MOST form) Yellow form placed in chart (order not valid for inpatient use)     Chief Complaint  Patient presents with  . Medical Management of Chronic Issues    HPI:  Pt is a 84 y.o. female seen today for medical management of chronic diseases.   She moved to skilled care one month ago due to fairly rapid decline in cognition and function associated with vascular dementia. She is followed by OT and is now a stand up lift for transfers. ST is evaluating her today for swallow function. She has a hx of dysphagia. The staff report no issues with coughing or choking. Currently on a regular diet.  The staff report that she has pain in the left knee and both feet at times. Due to the resident's dementia most of the hx is obtained from the staff. There is no hx of peripheral neuropathy, no reports of numbness or burning, only pain to the feet and left knee.  Functional status: continent with toileting program  per nurse, stand up lift   Past Medical History:  Diagnosis Date  . Hypertension   . Macular degeneration   . Myocardial infarct (HCC)   . Urinary incontinence   . Venous stasis    Past Surgical History:  Procedure Laterality Date  . CORONARY ANGIOPLASTY WITH STENT PLACEMENT      Allergies  Allergen Reactions  . Erythromycin   . Penicillins     Outpatient Encounter Medications as of 06/05/2018  Medication Sig  . acetaminophen (TYLENOL) 325 MG tablet Take 650 mg by mouth 2 (two) times daily. And q 8 hrs prn  . amLODipine (NORVASC) 5 MG tablet Take 5 mg by mouth daily.  Marland Kitchen. antiseptic oral rinse (BIOTENE) LIQD 15 mLs by Mouth Rinse route 2 (two) times daily.  Marland Kitchen. aspirin EC 81 MG tablet Take 81 mg by mouth every 3 (three) days.  Marland Kitchen. atenolol (TENORMIN) 25 MG tablet Take by mouth 2 (two) times daily.  . mirabegron ER (MYRBETRIQ) 25 MG TB24 tablet Take 25 mg by mouth daily.  . Multiple Vitamins-Minerals (CENTRUM SILVER ULTRA WOMENS) TABS Take 1 tablet by mouth daily.  . multivitamin-lutein (OCUVITE-LUTEIN) CAPS capsule Take 1 capsule by mouth daily.  Marland Kitchen. senna (SENOKOT) 8.6 MG TABS tablet Take 1 tablet by mouth 2 (two) times daily.   No facility-administered encounter medications on file as of 06/05/2018.     Review of Systems  Unable to perform ROS: Dementia    Immunization History  Administered Date(s) Administered  . Influenza, High Dose Seasonal  PF 01/04/2018  . Influenza-Unspecified 09/30/2017   Pertinent  Health Maintenance Due  Topic Date Due  . DEXA SCAN  07/05/2018 (Originally 09/11/1987)  . INFLUENZA VACCINE  07/31/2018  . PNA vac Low Risk Adult (2 of 2 - PPSV23) 05/24/2019   Fall Risk  04/09/2018 02/18/2018 01/01/2018  Falls in the past year? Yes Yes No  Number falls in past yr: - 1 -  Injury with Fall? - No -   Functional Status Survey:   Wt Readings from Last 3 Encounters:  06/05/18 130 lb 3.2 oz (59.1 kg)  05/13/18 135 lb (61.2 kg)  04/09/18 134 lb (60.8 kg)    Vitals:   06/05/18 1119  Weight: 130 lb 3.2 oz (59.1 kg)   Body mass index is 23.81 kg/m. Physical Exam  Constitutional: No distress.  HENT:  Head: Normocephalic and atraumatic.  Very dry mouth  Eyes: Pupils are equal, round, and reactive to light. Conjunctivae are normal. Right eye exhibits no discharge. Left eye exhibits no discharge.  Neck: No JVD present.  Cardiovascular: Normal rate and regular rhythm.  No murmur heard. Pulmonary/Chest: Effort normal and breath sounds normal. No respiratory distress.  Abdominal: Soft. Bowel sounds are normal.  Musculoskeletal: She exhibits tenderness (grimaces when her feet are touched. No pain with palpation of either knee. Pain with ROM of the right and left knee and crepitus noted. No erythema or swelling. ). She exhibits no edema.  Lymphadenopathy:    She has no cervical adenopathy.  Neurological: She is alert. No cranial nerve deficit.  Oriented to self only. Intermittently able to f/c.  Skin: Skin is warm and dry. She is not diaphoretic.  Very dry skin with mild tenting at the chest  Psychiatric: She has a normal mood and affect.    Labs reviewed: Recent Labs    04/04/18 0600  NA 137  K 4.9  BUN 22*  CREATININE 1.1   No results for input(s): AST, ALT, ALKPHOS, BILITOT, PROT, ALBUMIN in the last 8760 hours. Recent Labs    04/04/18 0600  WBC 6.3  HGB 13.1  HCT 39  PLT 189   Lab Results  Component Value Date   TSH 3.62 04/25/2018   No results found for: HGBA1C No results found for: CHOL, HDL, LDLCALC, LDLDIRECT, TRIG, CHOLHDL  Significant Diagnostic Results in last 30 days:  No results found.  Assessment/Plan  Dysphagia ST to eval and tx as indicated Noted 5 lb weight loss  Essential hypertension Controlled. Continue Norvasc 5 mg qd and atenolol 25 mg bid.   Dementia Fairly rapid decline in cognition. Continue supportive care and skilled environment. Continue baby aspirin.   Physical deconditioning She  now uses the stand up lift for transfers. She worked with PT but was discharged due to lack of progress.  Joint pain She has pain in both knees and both feet. No swelling or redness. ?neuropathy vs arthritic pain. Will schedule tylenol 650 mg BID and q 8 hrs prn. If no improvement we can consider additional management but she sleeps much of the time and I wanted to avoid sedating medication.   Xerostomia She appears dry on exam and currently uses Biotene.  She does not drink enough water as she can not remember to do so. I have asked the staff to encourage water while she is awake   Family/ staff Communication: staff  Labs/tests ordered:  NA

## 2018-06-05 NOTE — Assessment & Plan Note (Signed)
She appears dry on exam and currently uses Biotene.  She does not drink enough water as she can not remember to do so. I have asked the staff to encourage water while she is awake

## 2018-07-08 ENCOUNTER — Non-Acute Institutional Stay (SKILLED_NURSING_FACILITY): Payer: Medicare Other | Admitting: Internal Medicine

## 2018-07-08 ENCOUNTER — Encounter: Payer: Self-pay | Admitting: Internal Medicine

## 2018-07-08 DIAGNOSIS — N3281 Overactive bladder: Secondary | ICD-10-CM | POA: Diagnosis not present

## 2018-07-08 DIAGNOSIS — R54 Age-related physical debility: Secondary | ICD-10-CM

## 2018-07-08 DIAGNOSIS — K5901 Slow transit constipation: Secondary | ICD-10-CM

## 2018-07-08 DIAGNOSIS — F0150 Vascular dementia without behavioral disturbance: Secondary | ICD-10-CM | POA: Diagnosis not present

## 2018-07-08 DIAGNOSIS — I10 Essential (primary) hypertension: Secondary | ICD-10-CM

## 2018-07-08 NOTE — Progress Notes (Signed)
Patient ID: Sara OaksLillie Minjares, female   DOB: 1920-10-12, 84 y.o.   MRN: [id number]  Location:  [facility] Room Number: 105 Place of Service:  SNF ([phone]) Provider:  Kermit Baloeed, Miller Edgington L, DO  Patient Care Team: Kermit Baloeed, Makailey Hodgkin L, DO as PCP - General (Geriatric Medicine)  Extended Emergency Contact Information Primary Emergency Contact: Rao,Dan Address: [address]          [place], [place] [address] [place] Mobile Phone: [phone] Relation: Grandson Secondary Emergency Contact: Andrey Farmerntrikin, Joann Mobile Phone: [phone] Relation: Other  Code Status:  DNR Goals of care: Advanced Directive information Advanced Directives 07/08/2018  Does Patient Have a Medical Advance Directive? Yes  Type of Estate agentAdvance Directive Healthcare Power of [place];Living will;Out of facility DNR (pink MOST or yellow form)  Does patient want to make changes to medical advance directive? No - Patient declined  Copy of Healthcare Power of Attorney in Chart? Yes  Pre-existing out of facility DNR order (yellow form or pink MOST form) Yellow form placed in chart (order not valid for inpatient use);Pink MOST form placed in chart (order not valid for inpatient use)   Chief Complaint  Patient presents with  . Medical Management of Chronic Issues    Routine Visit    HPI:  Pt is a 84 y.o. female with advanced dementia, OAB, constipation, osteoarthritis, and htn seen today for medical management of chronic diseases.     She c/o being cold.  She was sitting up in her wheelchair in her apt watching TV wrapped in her blanket.  She had no other complaints whatsoever.  Nursing staff also did not mention any concerns on second shift.    Notes indicate she got a skin tear found last week on her right leg.  It is being cleaned and dressed appropriately ans lowly healing     She is functionally dependent requiring SNF level of care for ADLs and needs set-up for meals.  Her  grandson is involved in her care.    Bowels are moving on senokot-s.  Remains on myrbetriq.  Unclear how beneficial this is at this point with her incontinence.    BP is well controlled with atenolol and norvasc.    Weight is down about 4-5 lbs over the past 3-4 mos.  She has dysphagia.  She's on a dysphagia 2 diet (ground with thin liquids and full supervision).   Past Medical History:  Diagnosis Date  . Hypertension   . Macular degeneration   . Myocardial infarct (HCC)   . Urinary incontinence   . Venous stasis    Past Surgical History:  Procedure Laterality Date  . CORONARY ANGIOPLASTY WITH STENT PLACEMENT      Allergies  Allergen Reactions  . Erythromycin   . Penicillins     Outpatient Encounter Medications as of 07/08/2018  Medication Sig  . acetaminophen (TYLENOL) 325 MG tablet Take 650 mg by mouth 2 (two) times daily. And q 8 hrs prn  . amLODipine (NORVASC) 5 MG tablet Take 5 mg by mouth daily.  Marland Kitchen. antiseptic oral rinse (BIOTENE) LIQD 15 mLs by Mouth Rinse route 2 (two) times daily.  Marland Kitchen. aspirin EC 81 MG tablet Take 81 mg by mouth every 3 (three) days.  Marland Kitchen. atenolol (TENORMIN) 25 MG tablet Take by mouth 2 (two) times daily.  . mirabegron ER (MYRBETRIQ) 25 MG TB24 tablet Take 25 mg by mouth daily.  . Multiple Vitamins-Minerals (CENTRUM SILVER ULTRA WOMENS) TABS Take 1 tablet by mouth  daily.  . multivitamin-lutein (OCUVITE-LUTEIN) CAPS capsule Take 1 capsule by mouth daily.  Marland Kitchen senna (SENOKOT) 8.6 MG TABS tablet Take 1 tablet by mouth 2 (two) times daily.   No facility-administered encounter medications on file as of 07/08/2018.     Review of Systems  Constitutional: Positive for fatigue. Negative for appetite change and fever.  HENT: Positive for hearing loss.   Eyes:       Glasses  Respiratory: Negative for chest tightness and shortness of breath.   Cardiovascular: Negative for chest pain, palpitations and leg swelling.  Gastrointestinal: Negative for abdominal pain.    Genitourinary: Negative for dysuria.       OAB, incontinent  Musculoskeletal: Positive for arthralgias and gait problem.  Skin: Negative for color change.       Skin tear right leg gradually healing  Allergic/Immunologic:       Feels cold  Neurological: Positive for weakness. Negative for dizziness.  Hematological: Bruises/bleeds easily.  Psychiatric/Behavioral: Positive for agitation and confusion.    Immunization History  Administered Date(s) Administered  . Influenza, High Dose Seasonal PF 01/04/2018  . Influenza-Unspecified 09/30/2017  . Pneumococcal Conjugate-13 05/25/2018   Pertinent  Health Maintenance Due  Topic Date Due  . DEXA SCAN  09/11/1987  . INFLUENZA VACCINE  07/31/2018  . PNA vac Low Risk Adult (2 of 2 - PPSV23) 05/24/2019   Fall Risk  04/09/2018 02/18/2018 01/01/2018  Falls in the past year? Yes Yes No  Number falls in past yr: - 1 -  Injury with Fall? - No -   Functional Status Survey:    Vitals:   07/08/18 1448  BP: 134/72  Pulse: 67  Resp: 20  Temp: 98.2 F (36.8 C)  TempSrc: Oral  SpO2: 98%  Weight: 130 lb (59 kg)  Height: 5\' 2"  (1.575 m)   Body mass index is 23.78 kg/m. Physical Exam  Constitutional: No distress.  HENT:  Head: Normocephalic and atraumatic.  Eyes:  glasses  Cardiovascular: Normal rate, regular rhythm, normal heart sounds and intact distal pulses.  Pulmonary/Chest: Effort normal and breath sounds normal. No respiratory distress.  Abdominal: Soft. Bowel sounds are normal. She exhibits no distension. There is no tenderness.  Musculoskeletal: Normal range of motion.  Sitting up in wheelchair with blanket around her  Neurological: She is alert. She displays abnormal reflex.  Skin: Skin is warm and dry.  Healing skin tear right leg  Psychiatric:  seems to want to be left alone    Labs reviewed: Recent Labs    04/04/18 0600  NA 137  K 4.9  BUN 22*  CREATININE 1.1   No results for input(s): AST, ALT, ALKPHOS,  BILITOT, PROT, ALBUMIN in the last 8760 hours. Recent Labs    04/04/18 0600  WBC 6.3  HGB 13.1  HCT 39  PLT 189   Lab Results  Component Value Date   TSH 3.62 04/25/2018   Assessment/Plan 1. Vascular dementia without behavioral disturbance -progressive, dependent in adls with need for set up at meals and modified diet for her dysphagia  2. Essential hypertension -bp controlled with current regimen, no changes needed  3. Overactive bladder -remains on myrbetriq--? helping  4. Frailty syndrome in geriatric patient -ongoing, weight down just slightly over past few months and likely dysphagia related  5. Slow transit constipation -cont current bowel regimen which has been effective  Family/ staff Communication: discussed with snf nurse  Labs/tests ordered:  No new  Gerilynn Mccullars L. Sheva Mcdougle, D.O. Geriatrics [facility]  [facility] [address], [place] [address] Cell Phone (Mon-Fri 8am-5pm):  [phone] On Call:  [phone] & follow prompts after 5pm & weekends Office Phone:  [phone] Office Fax:  [phone]

## 2018-08-04 ENCOUNTER — Encounter: Payer: Self-pay | Admitting: Adult Health

## 2018-08-04 ENCOUNTER — Non-Acute Institutional Stay (SKILLED_NURSING_FACILITY): Payer: Medicare Other | Admitting: Adult Health

## 2018-08-04 DIAGNOSIS — N3281 Overactive bladder: Secondary | ICD-10-CM | POA: Diagnosis not present

## 2018-08-04 DIAGNOSIS — F0150 Vascular dementia without behavioral disturbance: Secondary | ICD-10-CM

## 2018-08-04 DIAGNOSIS — R634 Abnormal weight loss: Secondary | ICD-10-CM

## 2018-08-04 DIAGNOSIS — R1310 Dysphagia, unspecified: Secondary | ICD-10-CM

## 2018-08-04 DIAGNOSIS — K219 Gastro-esophageal reflux disease without esophagitis: Secondary | ICD-10-CM

## 2018-08-04 NOTE — Progress Notes (Signed)
[facility]   Place of Service:   SNF Provider:   Peggye Leyhristy Jahdai Padovano, ANP [facility] [phone]   Kermit Baloeed, Tiffany L, DO  Patient Care Team: Kermit Baloeed, Tiffany L, DO as PCP - General (Geriatric Medicine)  Extended Emergency Contact Information Primary Emergency Contact: Hebert,Dan Address: [address]          [place], [place] [address] [place] Mobile Phone: [phone] Relation: Lucila MaineGrandson Secondary Emergency Contact: Andrey Farmerntrikin, Joann Mobile Phone: [phone] Relation: Other  Code Status:  DNR Goals of care: Advanced Directive information Advanced Directives 07/08/2018  Does Patient Have a Medical Advance Directive? Yes  Type of Estate agentAdvance Directive Healthcare Power of [place];Living will;Out of facility DNR (pink MOST or yellow form)  Does patient want to make changes to medical advance directive? No - Patient declined  Copy of Healthcare Power of Attorney in Chart? Yes  Pre-existing out of facility DNR order (yellow form or pink MOST form) Yellow form placed in chart (order not valid for inpatient use);Pink MOST form placed in chart (order not valid for inpatient use)     Chief Complaint  Patient presents with  . Medical Management of Chronic Issues    HPI:  Pt is a 84 y.o. female seen today for medical management of chronic diseases.    Vascular Dementia: she has declined rapidly over the past year necessitating the need for skilled care.  Remains verbal but confused most of the time and can have periods of irritability per staff  Weight loss: Wt Readings from Last 3 Encounters:  08/04/18 126 lb 4.8 oz (57.3 kg)  07/08/18 130 lb (59 kg)  06/05/18 130 lb 3.2 oz (59.1 kg)   GERD: no reported symptoms CT 05/29/17 of the abd showed: Small hiatal hernia with distal esophageal contrast level suggests dysmotility or gastroesophageal reflux and could predispose the patient to aspiration.  OAB: off myrbetriq, no  reported issues of frequency or spasms  Functional status: needs assistance with all ADLs, incontinent, lift for transfers Past Medical History:  Diagnosis Date  . Dementia 01/01/2018  . Hypertension   . Macular degeneration   . Myocardial infarct (HCC)   . Urinary incontinence   . Venous stasis    Past Surgical History:  Procedure Laterality Date  . CORONARY ANGIOPLASTY WITH STENT PLACEMENT      Allergies  Allergen Reactions  . Erythromycin   . Penicillins     Outpatient Encounter Medications as of 08/04/2018  Medication Sig  . acetaminophen (TYLENOL) 325 MG tablet Take 650 mg by mouth 2 (two) times daily. And q 8 hrs prn  . amLODipine (NORVASC) 5 MG tablet Take 5 mg by mouth daily.  Marland Kitchen. antiseptic oral rinse (BIOTENE) LIQD 15 mLs by Mouth Rinse route 2 (two) times daily.  Marland Kitchen. aspirin EC 81 MG tablet Take 81 mg by mouth every 3 (three) days.  Marland Kitchen. atenolol (TENORMIN) 25 MG tablet Take by mouth 2 (two) times daily.  . Multiple Vitamins-Minerals (CENTRUM SILVER ULTRA WOMENS) TABS Take 1 tablet by mouth daily.  . multivitamin-lutein (OCUVITE-LUTEIN) CAPS capsule Take 1 capsule by mouth daily.  . Protein (UNJURY PO) Take 0.5 Scoops by mouth 2 (two) times daily.  Marland Kitchen. senna (SENOKOT) 8.6 MG TABS tablet Take 1 tablet by mouth 2 (two) times daily.  . [DISCONTINUED] mirabegron ER (MYRBETRIQ) 25 MG TB24 tablet Take 25 mg by mouth daily.   No facility-administered encounter medications on file as of 08/04/2018.     Review of  Systems  Unable to perform ROS: Dementia    Immunization History  Administered Date(s) Administered  . Influenza, High Dose Seasonal PF 01/04/2018  . Influenza-Unspecified 09/30/2017  . Pneumococcal Conjugate-13 05/25/2018   Pertinent  Health Maintenance Due  Topic Date Due  . INFLUENZA VACCINE  07/31/2018  . DEXA SCAN  09/04/2018 (Originally 09/11/1987)  . PNA vac Low Risk Adult  Completed   Fall Risk  04/09/2018 02/18/2018 01/01/2018  Falls in the past year? Yes Yes  No  Number falls in past yr: - 1 -  Injury with Fall? - No -   Functional Status Survey:    Vitals:   08/04/18 1433  Weight: 126 lb 4.8 oz (57.3 kg)   Body mass index is 23.1 kg/m.  Wt Readings from Last 3 Encounters:  08/04/18 126 lb 4.8 oz (57.3 kg)  07/08/18 130 lb (59 kg)  06/05/18 130 lb 3.2 oz (59.1 kg)   Physical Exam  Constitutional: No distress.  HENT:  Head: Normocephalic and atraumatic.  Nose: Nose normal.  Mouth/Throat: Oropharynx is clear and moist. No oropharyngeal exudate.  Neck: No JVD present.  Cardiovascular: Normal rate and regular rhythm.  No murmur heard. Pulmonary/Chest: Effort normal and breath sounds normal. No respiratory distress. She has no wheezes.  Abdominal: Soft. Bowel sounds are normal.  Multiple scars to lower abd  Neurological: She is alert.  Oriented to self only, intermittently able to f/c  Skin: Skin is warm and dry. She is not diaphoretic.  Psychiatric: She has a normal mood and affect.  Nursing note and vitals reviewed.   Labs reviewed: Recent Labs    04/04/18 0600  NA 137  K 4.9  BUN 22*  CREATININE 1.1   No results for input(s): AST, ALT, ALKPHOS, BILITOT, PROT, ALBUMIN in the last 8760 hours. Recent Labs    04/04/18 0600  WBC 6.3  HGB 13.1  HCT 39  PLT 189   Lab Results  Component Value Date   TSH 3.62 04/25/2018   No results found for: HGBA1C No results found for: CHOL, HDL, LDLCALC, LDLDIRECT, TRIG, CHOLHDL  Significant Diagnostic Results in last 30 days:  No results found.  Assessment/Plan  1. Vascular dementia without behavioral disturbance Unfortunately she has had a fairly rapid decline and now requires 24 hr care She remains on a baby aspirin q 3 days (she came on this dose to [facility]) No benefit to be given by adding additional medications.   2. Dysphagia, unspecified type Continue D2 diet with ground meats  3. Overactive bladder Doing well off myrbetriq  4. Weight loss 4 lb loss,  continue to monitor  Continue unjury ordered by nutrition  5. Gastroesophageal reflux disease without esophagitis She has a hx of this but is not currently symptomatic and does not need treatment at this time.   Tdap x 1 dose  Family/ staff Communication: staff  Labs/tests ordered:  NA

## 2018-08-06 ENCOUNTER — Encounter: Payer: Self-pay | Admitting: Internal Medicine

## 2018-08-07 ENCOUNTER — Encounter: Payer: Self-pay | Admitting: Adult Health

## 2018-08-07 DIAGNOSIS — R634 Abnormal weight loss: Secondary | ICD-10-CM | POA: Insufficient documentation

## 2018-09-11 ENCOUNTER — Non-Acute Institutional Stay (SKILLED_NURSING_FACILITY): Payer: Medicare Other | Admitting: Adult Health

## 2018-09-11 DIAGNOSIS — K5901 Slow transit constipation: Secondary | ICD-10-CM

## 2018-09-11 DIAGNOSIS — D692 Other nonthrombocytopenic purpura: Secondary | ICD-10-CM | POA: Diagnosis not present

## 2018-09-11 DIAGNOSIS — I10 Essential (primary) hypertension: Secondary | ICD-10-CM | POA: Diagnosis not present

## 2018-09-11 DIAGNOSIS — R1310 Dysphagia, unspecified: Secondary | ICD-10-CM

## 2018-09-11 DIAGNOSIS — F0150 Vascular dementia without behavioral disturbance: Secondary | ICD-10-CM | POA: Diagnosis not present

## 2018-09-11 DIAGNOSIS — R634 Abnormal weight loss: Secondary | ICD-10-CM

## 2018-09-12 ENCOUNTER — Encounter: Payer: Self-pay | Admitting: Adult Health

## 2018-09-12 NOTE — Progress Notes (Signed)
Location:  Medical illustrator of Service:  SNF (31) Provider:   Peggye Ley, ANP [facility] [phone]   Kermit Balo, DO  Patient Care Team: Kermit Balo, DO as PCP - General (Geriatric Medicine)  Extended Emergency Contact Information Primary Emergency Contact: Corrie,Dan Address: [address]          [place], [place] [address] [place] Phone: [phone] Relation: Lucila [place] Secondary Emergency Contact: Dajuana, [place] Mobile Phone: [phone] Relation: Other  Code Status:  DNR Goals of care: Advanced Directive information Advanced Directives 07/08/2018  Does Patient Have a Medical Advance Directive? Yes  Type of Estate agent of [place];Living will;Out of facility DNR (pink MOST or yellow form)  Does patient want to make changes to medical advance directive? No - Patient declined  Copy of Healthcare Power of Attorney in Chart? Yes  Pre-existing out of facility DNR order (yellow form or pink MOST form) Yellow form placed in chart (order not valid for inpatient use);Pink MOST form placed in chart (order not valid for inpatient use)     Chief Complaint  Patient presents with  . Medical Management of Chronic Issues    HPI:  Pt is a 84 y.o. female seen today for medical management of chronic diseases.    Dysphagia: no reports of coughing or choking on D3 with thin liquids  Weight loss: has lost 6 lbs in the past three months Wt Readings from Last 3 Encounters:  09/12/18 124 lb 12.8 oz (56.6 kg)  08/04/18 126 lb 4.8 oz (57.3 kg)  07/08/18 130 lb (59 kg)   HTN: BP runs in the 110's on norvasc and atenolol, occasional slow HR recorded but other WNL  Vascular dementia: rapid decline with weight loss and need for assistance with all ADLs in the skilled setting  Constipation: no reports of issues  On senokot liquid  Staff have reported bruises to her lower extremities, thin  skin, and staining.   Functional status: hoyer lift and incontinent Past Medical History:  Diagnosis Date  . Coronary artery disease involving native coronary artery of native heart without angina pectoris 05/29/2017  . Dementia 01/01/2018  . Dysphagia 01/01/2018  . Gastroesophageal reflux disease   . Hypertension   . Macular degeneration   . Myocardial infarct (HCC)   . Urinary incontinence   . Venous stasis    Past Surgical History:  Procedure Laterality Date  . CORONARY ANGIOPLASTY WITH STENT PLACEMENT      Allergies  Allergen Reactions  . Erythromycin   . Penicillins     Outpatient Encounter Medications as of 09/11/2018  Medication Sig  . acetaminophen (TYLENOL) 325 MG tablet Take 650 mg by mouth 2 (two) times daily. And q 8 hrs prn  . amLODipine (NORVASC) 5 MG tablet Take 5 mg by mouth daily.  Marland Kitchen antiseptic oral rinse (BIOTENE) LIQD 15 mLs by Mouth Rinse route 2 (two) times daily.  Marland Kitchen aspirin EC 81 MG tablet Take 81 mg by mouth every 3 (three) days.  Marland Kitchen atenolol (TENORMIN) 25 MG tablet Take by mouth 2 (two) times daily.  . Multiple Vitamins-Minerals (CENTRUM SILVER ULTRA WOMENS) TABS Take 1 tablet by mouth daily.  . multivitamin-lutein (OCUVITE-LUTEIN) CAPS capsule Take 1 capsule by mouth daily.  . Protein (UNJURY PO) Take 0.5 Scoops by mouth 2 (two) times daily.  Marland Kitchen senna (SENOKOT) 176 MG/5ML SYRP Take 10 mLs by mouth daily.  . [DISCONTINUED] senna (SENOKOT) 8.6 MG TABS tablet  Take 1 tablet by mouth 2 (two) times daily.   No facility-administered encounter medications on file as of 09/11/2018.     Review of Systems  Constitutional: Negative for activity change, appetite change, chills, diaphoresis, fatigue, fever and unexpected weight change.  HENT: Negative for congestion.   Respiratory: Negative for cough, shortness of breath and wheezing.   Cardiovascular: Negative for chest pain, palpitations and leg swelling.  Gastrointestinal: Negative for abdominal distention,  abdominal pain, constipation and diarrhea.  Genitourinary: Negative for difficulty urinating and dysuria.  Musculoskeletal: Positive for gait problem. Negative for arthralgias, back pain, joint swelling and myalgias.  Skin: Positive for color change.  Neurological: Positive for weakness. Negative for dizziness, tremors, seizures, syncope, facial asymmetry, speech difficulty, light-headedness, numbness and headaches.  Psychiatric/Behavioral: Positive for confusion. Negative for agitation and behavioral problems.    Immunization History  Administered Date(s) Administered  . Influenza, High Dose Seasonal PF 01/04/2018  . Influenza-Unspecified 09/30/2017  . Pneumococcal Conjugate-13 05/25/2018   Pertinent  Health Maintenance Due  Topic Date Due  . DEXA SCAN  09/11/1987  . INFLUENZA VACCINE  07/31/2018  . PNA vac Low Risk Adult  Completed   Fall Risk  04/09/2018 02/18/2018 01/01/2018  Falls in the past year? Yes Yes No  Number falls in past yr: - 1 -  Injury with Fall? - No -   Functional Status Survey:    Vitals:   09/12/18 1140  Weight: 124 lb 12.8 oz (56.6 kg)   Body mass index is 22.83 kg/m. Physical Exam  Constitutional: No distress.  HENT:  Head: Normocephalic and atraumatic.  Mouth/Throat: No oropharyngeal exudate.  Neck: No JVD present.  Cardiovascular: Normal rate and regular rhythm.  No murmur heard. Pulmonary/Chest: Effort normal and breath sounds normal. No respiratory distress. She has no wheezes.  Abdominal: Soft. Bowel sounds are normal. She exhibits no distension. There is no tenderness.  Neurological: She is alert.  Oriented to self only, able to f/c  Skin: Skin is warm and dry. She is not diaphoretic.  Darkened brown skin to BLE with purpura noted. Very thin skin  Psychiatric: She has a normal mood and affect.    Labs reviewed: Recent Labs    04/04/18 0600  NA 137  K 4.9  BUN 22*  CREATININE 1.1   No results for input(s): AST, ALT, ALKPHOS,  BILITOT, PROT, ALBUMIN in the last 8760 hours. Recent Labs    04/04/18 0600  WBC 6.3  HGB 13.1  HCT 39  PLT 189   Lab Results  Component Value Date   TSH 3.62 04/25/2018   No results found for: HGBA1C No results found for: CHOL, HDL, LDLCALC, LDLDIRECT, TRIG, CHOLHDL  Significant Diagnostic Results in last 30 days:  No results found.  Assessment/Plan 1. Senile purpura (HCC) Noted to arms and legs with thin skin Geri sleeves in place  2. Vascular dementia without behavioral disturbance Fairly rapid decline in cognition and function  Continue supportive care  3. Slow transit constipation Continue senokot 10 ml qd  4. Essential hypertension Controlled, consider dose reduction in atenolol   5. Weight loss Continue unjury BID  6. Dysphagia, unspecified type No reported issues with current diet Continue D3 diet with thin liqiud    Family/ staff Communication: discussed with nurse  Labs/tests ordered:  NA

## 2018-09-22 ENCOUNTER — Non-Acute Institutional Stay (SKILLED_NURSING_FACILITY): Payer: Medicare Other | Admitting: Adult Health

## 2018-09-22 ENCOUNTER — Encounter: Payer: Self-pay | Admitting: Adult Health

## 2018-09-22 DIAGNOSIS — L03039 Cellulitis of unspecified toe: Secondary | ICD-10-CM

## 2018-09-22 DIAGNOSIS — B353 Tinea pedis: Secondary | ICD-10-CM

## 2018-09-22 NOTE — Progress Notes (Signed)
[facility]   Place of Service:  SNF (31) Provider:   Peggye Leyhristy Romar Woodrick, ANP [facility] [phone]   Kermit Baloeed, Tiffany L, DO  Patient Care Team: Kermit Baloeed, Tiffany L, DO as PCP - General (Geriatric Medicine)  Extended Emergency Contact Information Primary Emergency Contact: Klingerman,Dan Address: [address]          [place], [place] [address] [place] Mobile Phone: [phone] Relation: Lucila MaineGrandson Secondary Emergency Contact: Andrey Farmerntrikin, Joann Mobile Phone: [phone] Relation: Other  Code Status:  DNR Goals of care: Advanced Directive information Advanced Directives 07/08/2018  Does Patient Have a Medical Advance Directive? Yes  Type of Estate agentAdvance Directive Healthcare Power of CathayAttorney;Living will;Out of facility DNR (pink MOST or yellow form)  Does patient want to make changes to medical advance directive? No - Patient declined  Copy of Healthcare Power of Attorney in Chart? Yes  Pre-existing out of facility DNR order (yellow form or pink MOST form) Yellow form placed in chart (order not valid for inpatient use);Pink MOST form placed in chart (order not valid for inpatient use)     Chief Complaint  Patient presents with  . Acute Visit    toe cellulitis    HPI:  Pt is a 84 y.o. female seen today for an acute visit for toe cellulitis. The nurse reports that they noticed redness and drainage to both feet in between the great and second toe.  The area is tender. No fever. There is associated swelling and redness. ONcall psc provider was notified on 9/21 and the drainage was cultured and she was placed on doxycycline.    Past Medical History:  Diagnosis Date  . Coronary artery disease involving native coronary artery of native heart without angina pectoris 05/29/2017  . Dementia 01/01/2018  . Dysphagia 01/01/2018  . Gastroesophageal reflux disease   . Hypertension   . Macular degeneration   . Myocardial infarct (HCC)   .  Urinary incontinence   . Venous stasis    Past Surgical History:  Procedure Laterality Date  . CORONARY ANGIOPLASTY WITH STENT PLACEMENT      Allergies  Allergen Reactions  . Erythromycin   . Penicillins     Outpatient Encounter Medications as of 09/22/2018  Medication Sig  . amLODipine (NORVASC) 5 MG tablet Take 5 mg by mouth daily.  Marland Kitchen. doxycycline (DORYX) 100 MG EC tablet Take 100 mg by mouth 2 (two) times daily.  . Protein (UNJURY PO) Take 0.5 Scoops by mouth 2 (two) times daily.  Marland Kitchen. senna (SENOKOT) 176 MG/5ML SYRP Take 10 mLs by mouth daily.  Marland Kitchen. acetaminophen (TYLENOL) 325 MG tablet Take 650 mg by mouth 2 (two) times daily. And q 8 hrs prn  . antiseptic oral rinse (BIOTENE) LIQD 15 mLs by Mouth Rinse route 2 (two) times daily.  Marland Kitchen. aspirin EC 81 MG tablet Take 81 mg by mouth every 3 (three) days.  Marland Kitchen. atenolol (TENORMIN) 25 MG tablet Take by mouth 2 (two) times daily.  . Multiple Vitamins-Minerals (CENTRUM SILVER ULTRA WOMENS) TABS Take 1 tablet by mouth daily.  . multivitamin-lutein (OCUVITE-LUTEIN) CAPS capsule Take 1 capsule by mouth daily.   No facility-administered encounter medications on file as of 09/22/2018.     Review of Systems  Unable to perform ROS: Dementia    Immunization History  Administered Date(s) Administered  . Influenza, High Dose Seasonal PF 01/04/2018  . Influenza-Unspecified 09/30/2017  . Pneumococcal Conjugate-13 05/25/2018   Pertinent  Health Maintenance Due  Topic Date  Due  . DEXA SCAN  09/11/1987  . INFLUENZA VACCINE  07/31/2018  . PNA vac Low Risk Adult  Completed   Fall Risk  04/09/2018 02/18/2018 01/01/2018  Falls in the past year? Yes Yes No  Number falls in past yr: - 1 -  Injury with Fall? - No -   Functional Status Survey:    Vitals:   09/22/18 0943  BP: 109/66  Pulse: 65  Resp: (!) 22  Temp: 98.3 F (36.8 C)  SpO2: 97%   There is no height or weight on file to calculate BMI. Physical Exam  Constitutional: No distress.    HENT:  Head: Normocephalic and atraumatic.  Neck: No JVD present.  Cardiovascular: Normal rate and regular rhythm.  No murmur heard. BPPP +1  Pulmonary/Chest: Effort normal and breath sounds normal. No respiratory distress. She has no wheezes.  Neurological: She is alert.  Oriented to self  Skin: Skin is warm and dry. She is not diaphoretic. There is erythema ( spreading to the mid foot and swelling to both feet right>left. ).  Small open area in between both great and second toe areas with green drainage noted. Mild warmth. Foot is tender to touch  Psychiatric: She has a normal mood and affect.    Labs reviewed: Recent Labs    04/04/18 0600  NA 137  K 4.9  BUN 22*  CREATININE 1.1   No results for input(s): AST, ALT, ALKPHOS, BILITOT, PROT, ALBUMIN in the last 8760 hours. Recent Labs    04/04/18 0600  WBC 6.3  HGB 13.1  HCT 39  PLT 189   Lab Results  Component Value Date   TSH 3.62 04/25/2018   No results found for: HGBA1C No results found for: CHOL, HDL, LDLCALC, LDLDIRECT, TRIG, CHOLHDL  Significant Diagnostic Results in last 30 days:  No results found.  Assessment/Plan  1. Cellulitis of toe, unspecified laterality Continue doxycycline to complete 7 day course Await culture data and adjust therapy accordingly Keep feet elevated Consult podiatry for nail care May use ultram 25 mg q 6 hrs prn pain, give prior to foot care  2. Tinea pedis of both feet Clean and dry feet daily with warm soapy water Ensure that in between toes are dried well Ketoconazole cream 2% qd x 4 weeks to both feet and in between the toes   Family/ staff Communication: resident and staff  Labs/tests ordered:  CBC BMP A1C

## 2018-09-25 ENCOUNTER — Encounter: Payer: Self-pay | Admitting: Adult Health

## 2018-09-25 ENCOUNTER — Non-Acute Institutional Stay (SKILLED_NURSING_FACILITY): Payer: Medicare Other | Admitting: Adult Health

## 2018-09-25 DIAGNOSIS — L03039 Cellulitis of unspecified toe: Secondary | ICD-10-CM | POA: Diagnosis not present

## 2018-09-25 DIAGNOSIS — F0150 Vascular dementia without behavioral disturbance: Secondary | ICD-10-CM | POA: Diagnosis not present

## 2018-09-25 DIAGNOSIS — S30810A Abrasion of lower back and pelvis, initial encounter: Secondary | ICD-10-CM | POA: Diagnosis not present

## 2018-09-25 DIAGNOSIS — E860 Dehydration: Secondary | ICD-10-CM

## 2018-09-25 NOTE — Progress Notes (Signed)
Location:  Medical illustrator of Service:  SNF (31) Provider:   Peggye Ley, ANP [facility] [phone]   Kermit Balo, DO  Patient Care Team: Kermit Balo, DO as PCP - General (Geriatric Medicine)  Extended Emergency Contact Information Primary Emergency Contact: Mcdougle,Dan Address: [address]          [place], [place] [address] [place] Phone: [phone] Relation: Lucila Maine Secondary Emergency Contact: Virgilia, Quigg Mobile Phone: [phone] Relation: Other  Code Status:  DNR Goals of care: Advanced Directive information Advanced Directives 07/08/2018  Does Patient Have a Medical Advance Directive? Yes  Type of Estate agent of [place];Living will;Out of facility DNR (pink MOST or yellow form)  Does patient want to make changes to medical advance directive? No - Patient declined  Copy of Healthcare Power of Attorney in Chart? Yes  Pre-existing out of facility DNR order (yellow form or pink MOST form) Yellow form placed in chart (order not valid for inpatient use);Pink MOST form placed in chart (order not valid for inpatient use)     Chief Complaint  Patient presents with  . Acute Visit    f/u wounds and culture    HPI:  Pt is a 84 y.o. female seen today for an acute visit for follow up regarding her toe wounds. She was started on Doxycycline on 09/22/18 due to cellulitis in both feet in between the great toe and second  probably secondary due to a fungal infection. She now also has redness and excoriation to both buttocks and the nurse is requesting an air mattress. She has been afebrile. Her feet can be painful and ultram is ordered as needed and used prior to dressing changes. A wound culture returned 09/25/2018 from toe drainage and grew pseudomonas. CBC and BMP were drawn and showed a NA of 146, Ca 11.6, and BUN of 33. Staff report that she does not drink enough fluid and has  progressively lost weight.    Past Medical History:  Diagnosis Date  . Coronary artery disease involving native coronary artery of native heart without angina pectoris 05/29/2017  . Dementia 01/01/2018  . Dysphagia 01/01/2018  . Gastroesophageal reflux disease   . Hypertension   . Macular degeneration   . Myocardial infarct (HCC)   . Urinary incontinence   . Venous stasis    Past Surgical History:  Procedure Laterality Date  . CORONARY ANGIOPLASTY WITH STENT PLACEMENT      Allergies  Allergen Reactions  . Erythromycin   . Penicillins     Outpatient Encounter Medications as of 09/25/2018  Medication Sig  . acetaminophen (TYLENOL) 325 MG tablet Take 650 mg by mouth 2 (two) times daily. And q 8 hrs prn  . amLODipine (NORVASC) 5 MG tablet Take 5 mg by mouth daily.  Marland Kitchen antiseptic oral rinse (BIOTENE) LIQD 15 mLs by Mouth Rinse route 2 (two) times daily.  Marland Kitchen aspirin EC 81 MG tablet Take 81 mg by mouth every 3 (three) days.  Marland Kitchen atenolol (TENORMIN) 25 MG tablet Take by mouth 2 (two) times daily.  Marland Kitchen doxycycline (DORYX) 100 MG EC tablet Take 100 mg by mouth 2 (two) times daily.  . Multiple Vitamins-Minerals (CENTRUM SILVER ULTRA WOMENS) TABS Take 1 tablet by mouth daily.  . multivitamin-lutein (OCUVITE-LUTEIN) CAPS capsule Take 1 capsule by mouth daily.  . Protein (UNJURY PO) Take 0.5 Scoops by mouth 2 (two) times daily.  Marland Kitchen senna (SENOKOT) 176 MG/5ML SYRP Take  10 mLs by mouth daily.   No facility-administered encounter medications on file as of 09/25/2018.     Review of Systems  Unable to perform ROS: Dementia    Immunization History  Administered Date(s) Administered  . Influenza, High Dose Seasonal PF 01/04/2018  . Influenza-Unspecified 09/30/2017  . Pneumococcal Conjugate-13 05/25/2018   Pertinent  Health Maintenance Due  Topic Date Due  . DEXA SCAN  09/11/1987  . INFLUENZA VACCINE  07/31/2018  . PNA vac Low Risk Adult  Completed   Fall Risk  04/09/2018 02/18/2018 01/01/2018    Falls in the past year? Yes Yes No  Number falls in past yr: - 1 -  Injury with Fall? - No -   Functional Status Survey:    Vitals:   09/25/18 1544  Temp: (!) 97.5 F (36.4 C)   There is no height or weight on file to calculate BMI. Physical Exam  Constitutional: No distress.  HENT:  Head: Normocephalic and atraumatic.  Neck: No JVD present.  Cardiovascular: Normal rate and regular rhythm.  No murmur heard. Pulmonary/Chest: Effort normal and breath sounds normal. No respiratory distress. She has no wheezes.  Abdominal: Soft. Bowel sounds are normal.  Neurological: She is alert.  Oriented to self only  Skin: Skin is warm and dry. She is not diaphoretic. There is erythema (with excoriation to both buttocks. Linear open area between gluteal cleft with 100% pink tissue).  Left 2nd toe with erythema and swelling to the mid foot, left second toe with yellow ulceration and small amt of yellow drainage. Right second toe with ulceration and no swelling, minimal erythema.  Psychiatric: She has a normal mood and affect.    Labs reviewed: Recent Labs    04/04/18 0600  NA 137  K 4.9  BUN 22*  CREATININE 1.1   No results for input(s): AST, ALT, ALKPHOS, BILITOT, PROT, ALBUMIN in the last 8760 hours. Recent Labs    04/04/18 0600  WBC 6.3  HGB 13.1  HCT 39  PLT 189   Lab Results  Component Value Date   TSH 3.62 04/25/2018   No results found for: HGBA1C No results found for: CHOL, HDL, LDLCALC, LDLDIRECT, TRIG, CHOLHDL  Significant Diagnostic Results in last 30 days:  No results found.  Assessment/Plan  1. Cellulitis of toe, unspecified laterality Minimal improvement in both toes Will discontinue Doxycycline and start Cipro 500 mg BID x 7 days with Florastor 1 cap BID x 7 days to cover pseudomonas (see below)  2. Excoriation of buttock, initial encounter Diflucan150 mg x 1 dose Air mattress Turn q 2 while in bed Keep dry  Wound care nurse to observe area in the  gluteal cleft and recommend protective dressing  3. Dehydration Encourage water while awake and nutritional supplements No further labs and IVF per conversation with her son  4. Vascular dementia I called and spoke with her POA and grandson, Dr. Llana Aliment. She has declined rather rapidly over the past year. He feels that she would not want to continue living in this state of health due to her poor quality of life in the skilled care setting. She is eating and drinking less and verbalizing less. Her function has reduced and now she is in a hoyer lift and requires assistance for all ADL's.  She has wounds to both feet and an excoriated bottom and is dehydrated based on the most recent BMP.  She has a most form that we reviewed during my conversation. Dr. Llana Aliment wishes for  her to have comfort care only with no hospitalizations, no antibiotics, and no IVF.  We did agree to an antibiotic in this instance as she has pain in both of her feet. We discussed that if she developed an infection such as pneumonia we would not intervene. He was given support as he was experiencing some guilt over this decision but ultimately realizes this is the best decision for her.   Family/ staff Communication: discussed with POA Dr. Llana Aliment  Labs/tests ordered:  NA

## 2018-09-25 NOTE — ACP (Advance Care Planning) (Signed)
I called and spoke with her POA and grandson, Dr. Llana Aliment. She has declined rather rapidly over the past year. He feels that she would not want to continue living in this state of health due to her poor quality of life in the skilled care setting. She is eating and drinking less and verbalizing less. Her function has reduced and now she is in a hoyer lift and requires assistance for all ADL's.  She has wounds to both feet and an excoriated bottom and is dehydrated based on the most recent BMP.  She has a most form that we reviewed during my conversation. Dr. Llana Aliment wishes for her to have comfort care only with no hospitalizations, no antibiotics, and no IVF.  We did agree to an antibiotic in this instance as she has pain in both of her feet. We discussed that if she developed an infection such as pneumonia we would not intervene. He was given support as he was experiencing some guilt over this decision but ultimately realizes this is the best decision for her.

## 2018-09-30 ENCOUNTER — Encounter: Payer: Self-pay | Admitting: Internal Medicine

## 2018-09-30 ENCOUNTER — Non-Acute Institutional Stay (SKILLED_NURSING_FACILITY): Payer: Medicare Other | Admitting: Internal Medicine

## 2018-09-30 DIAGNOSIS — B353 Tinea pedis: Secondary | ICD-10-CM | POA: Diagnosis not present

## 2018-09-30 DIAGNOSIS — L03039 Cellulitis of unspecified toe: Secondary | ICD-10-CM | POA: Diagnosis not present

## 2018-09-30 DIAGNOSIS — I10 Essential (primary) hypertension: Secondary | ICD-10-CM

## 2018-09-30 DIAGNOSIS — F0150 Vascular dementia without behavioral disturbance: Secondary | ICD-10-CM

## 2018-09-30 DIAGNOSIS — R54 Age-related physical debility: Secondary | ICD-10-CM

## 2018-09-30 NOTE — Progress Notes (Signed)
Patient ID: Sara Clark, female   DOB: [birth year], 84 y.o.   MRN: [id number]  Location:  [facility] Room Number: 105 Place of Service:  SNF [phone]) Provider:   Kermit Balo, DO  Patient Care Team: Kermit Balo, DO as PCP - General (Geriatric Medicine)  Extended Emergency Contact Information Primary Emergency Contact: Schooley,Dan Address: [address]          [place], [place] [address] [place] of [place] Phone: [phone] Relation: Grandson Secondary Emergency Contact: Channa, [place] Mobile Phone: [phone] Relation: Other  Code Status:  DNR Goals of care: Advanced Directive information Advanced Directives 09/30/2018  Does Patient Have a Medical Advance Directive? Yes  Type of Estate agent of [place];Living will;Out of facility DNR (pink MOST or yellow form)  Does patient want to make changes to medical advance directive? No - Patient declined  Copy of Healthcare Power of Attorney in Chart? Yes  Pre-existing out of facility DNR order (yellow form or pink MOST form) Yellow form placed in chart (order not valid for inpatient use);Pink MOST form placed in chart (order not valid for inpatient use)     Chief Complaint  Patient presents with  . Medical Management of Chronic Issues    Routine Visit    HPI:  Pt is a 84 y.o. female seen today for medical management of chronic diseases.    NP recently spoke with her grandson and updated her goals of care. She has been declining and failing to thrive, unfortunately.  Her dementia has progressed rapidly since her initial move here.  She now has a bed mat beside her bed to prevent major injury.  She is often resistant to care when staff try to help her--she gets very irritable and negative with them.    She tries to pull her feet back instead of sitting them on her wheelchair pedals.  She acquired a skin tear this way on the left leg.     She does not  eat well, but does take her supplemental shake.  She lost 2 lbs this month, but nursing notes it would have been more if it weren't for that.    She's been started on cipro and ketoconazole for an infection between her toes of her right foot and she now has a small wound on her right second toe.   She did not want to talk as usual when seen.    Past Medical History:  Diagnosis Date  . Coronary artery disease involving native coronary artery of native heart without angina pectoris 05/29/2017  . Dementia (HCC) 01/01/2018  . Dysphagia 01/01/2018  . Gastroesophageal reflux disease   . Hypertension   . Macular degeneration   . Myocardial infarct (HCC)   . Urinary incontinence   . Venous stasis    Past Surgical History:  Procedure Laterality Date  . CORONARY ANGIOPLASTY WITH STENT PLACEMENT      Allergies  Allergen Reactions  . Erythromycin   . Penicillins     Outpatient Encounter Medications as of 09/30/2018  Medication Sig  . acetaminophen (TYLENOL) 325 MG tablet Take 650 mg by mouth 2 (two) times daily. And q 8 hrs prn  . amLODipine (NORVASC) 5 MG tablet Take 5 mg by mouth daily.  Marland Kitchen antiseptic oral rinse (BIOTENE) LIQD 15 mLs by Mouth Rinse route 2 (two) times daily.  Marland Kitchen aspirin EC 81 MG tablet Take 81 mg by mouth every 3 (three) days.  Marland Kitchen atenolol (TENORMIN) 25  MG tablet Take by mouth 2 (two) times daily.  . ciprofloxacin (CIPRO) 500 MG tablet Take 500 mg by mouth 2 (two) times daily.  Marland Kitchen ketoconazole (NIZORAL) 2 % cream Apply 1 application topically 2 (two) times daily. To both feet  . Multiple Vitamins-Minerals (CENTRUM SILVER ULTRA WOMENS) TABS Take 1 tablet by mouth daily.  . multivitamin-lutein (OCUVITE-LUTEIN) CAPS capsule Take 1 capsule by mouth daily.  Marland Kitchen nystatin (NYSTATIN) powder Apply topically 2 (two) times daily.  . Protein (UNJURY PO) Take 0.5 Scoops by mouth 2 (two) times daily.  Marland Kitchen saccharomyces boulardii (FLORASTOR) 250 MG capsule Take 250 mg by mouth 2 (two) times  daily.  Marland Kitchen senna (SENOKOT) 176 MG/5ML SYRP Take 10 mLs by mouth daily.  . traMADol (ULTRAM) 50 MG tablet Take 50 mg by mouth every 6 (six) hours as needed (prior to dressing change).  . [DISCONTINUED] doxycycline (DORYX) 100 MG EC tablet Take 100 mg by mouth 2 (two) times daily.   No facility-administered encounter medications on file as of 09/30/2018.     Review of Systems  Constitutional: Positive for activity change, appetite change and fatigue. Negative for chills and fever.  HENT: Negative for congestion.   Eyes:       Glasses  Respiratory: Negative for shortness of breath.   Cardiovascular: Negative for chest pain and leg swelling.  Gastrointestinal: Negative for blood in stool, constipation, diarrhea, nausea and vomiting.  Genitourinary: Negative for dysuria.  Musculoskeletal: Positive for gait problem.  Skin:       Skin tears and toe ulceration  Neurological: Negative for dizziness.  Hematological: Bruises/bleeds easily.  Psychiatric/Behavioral: Positive for behavioral problems and confusion.    Immunization History  Administered Date(s) Administered  . Influenza, High Dose Seasonal PF 01/04/2018  . Influenza-Unspecified 09/30/2017  . Pneumococcal Conjugate-13 05/25/2018   Pertinent  Health Maintenance Due  Topic Date Due  . DEXA SCAN  09/11/1987  . INFLUENZA VACCINE  07/31/2018  . PNA vac Low Risk Adult  Completed   Fall Risk  04/09/2018 02/18/2018 01/01/2018  Falls in the past year? Yes Yes No  Number falls in past yr: - 1 -  Injury with Fall? - No -   Functional Status Survey:    Vitals:   09/30/18 1440  BP: (!) 147/80  Pulse: 71  Resp: 16  Temp: 98.4 F (36.9 C)  TempSrc: Oral  SpO2: 98%  Weight: 122 lb (55.3 kg)  Height: 5\' 2"  (1.575 m)   Body mass index is 22.31 kg/m. Physical Exam  Constitutional: No distress.  Frail and increasingly cachectic  Cardiovascular: Normal rate, regular rhythm, normal heart sounds and intact distal pulses.    Pulmonary/Chest: Effort normal and breath sounds normal. No respiratory distress.  Abdominal: Soft. Bowel sounds are normal.  Musculoskeletal: Normal range of motion.  Skin tear left lower extremity; right 2nd toe with small wound and open area between toes, mild erythema remains around this area  Neurological: She is alert.  Psychiatric:  Disinterested in our discussion    Labs reviewed: Recent Labs    04/04/18 0600  NA 137  K 4.9  BUN 22*  CREATININE 1.1   No results for input(s): AST, ALT, ALKPHOS, BILITOT, PROT, ALBUMIN in the last 8760 hours. Recent Labs    04/04/18 0600  WBC 6.3  HGB 13.1  HCT 39  PLT 189   Lab Results  Component Value Date   TSH 3.62 04/25/2018   Assessment/Plan 1. Vascular dementia without behavioral disturbance (HCC) -progressive and  fairly rapidly declining  -cont SNF level care -not on meds for her dementia at this advanced stage (would not benefit)  2. Cellulitis of toe, unspecified laterality -improved with cipro tx  3. Tinea pedis of both feet -completing ketoconazole course for athlete's foot as source of wound between toes   4. Essential hypertension -bp slightly elevated today, but would not push bp too low with her level of frailty now (<150/90 adequate)  5. Frailty syndrome in geriatric patient -cont supportive care, has MOST form and POA on file for her grandson who is a Industrial/product designer staff Communication: discussed with SNF nurse, had also just discussed case with NP who spoke with pt's grandson during toe infection visit  Labs/tests ordered:  No new  Harrietta Incorvaia L. Oliana Gowens, D.O. Geriatrics [facility] [address], [place] [address] Cell Phone (Mon-Fri 8am-5pm):  [phone] On Call:  [phone] & follow prompts after 5pm & weekends Office Phone:  [phone] Office Fax:  [phone]

## 2018-10-27 ENCOUNTER — Encounter: Payer: Self-pay | Admitting: Adult Health

## 2018-10-27 ENCOUNTER — Non-Acute Institutional Stay (SKILLED_NURSING_FACILITY): Payer: Medicare Other | Admitting: Adult Health

## 2018-10-27 DIAGNOSIS — L03031 Cellulitis of right toe: Secondary | ICD-10-CM | POA: Diagnosis not present

## 2018-10-27 DIAGNOSIS — R1312 Dysphagia, oropharyngeal phase: Secondary | ICD-10-CM | POA: Diagnosis not present

## 2018-10-27 DIAGNOSIS — M24561 Contracture, right knee: Secondary | ICD-10-CM | POA: Diagnosis not present

## 2018-10-27 NOTE — Progress Notes (Signed)
Location:  Medical illustrator of Service:  SNF (31) Provider:   Peggye Ley, ANP [facility] [phone]   Kermit Balo, DO  Patient Care Team: Kermit Balo, DO as PCP - General (Geriatric Medicine)  Extended Emergency Contact Information Primary Emergency Contact: Pellegrini,Dan Address: [address]          [place], [place] [address] Darden Amber of [place] Phone: [phone] Relation: Lucila [place] Secondary Emergency Contact: Velia, [place] Mobile Phone: [phone] Relation: Other  Code Status:  DNR Goals of care: Advanced Directive information Advanced Directives 09/30/2018  Does Patient Have a Medical Advance Directive? Yes  Type of Estate agent of [place];Living will;Out of facility DNR (pink MOST or yellow form)  Does patient want to make changes to medical advance directive? No - Patient declined  Copy of Healthcare Power of Attorney in Chart? Yes  Pre-existing out of facility DNR order (yellow form or pink MOST form) Yellow form placed in chart (order not valid for inpatient use);Pink MOST form placed in chart (order not valid for inpatient use)     Chief Complaint  Patient presents with  . Acute Visit    pain control    HPI:  Pt is a 84 y.o. female seen today for an acute visit for pain management. She has had progressive dementia with weakness and her goals of care are comfort based. Started on baclofen due to a right knee contracture with pain.  OT reports that her ROM has improved. Staff feel her pain is slightly better but continues to be an issue and they are asking for Roxanol. She is in a new supportive chair and seems more comfortable in it but in the bed she continues to have pain. Upon  My visit she was found to have a wound to her right foot. She has had it in the past and was treated with Cipro for pseudomonas. It healed and has apparently worsened again although it is not clear as  to when this re opened.    Past Medical History:  Diagnosis Date  . Coronary artery disease involving native coronary artery of native heart without angina pectoris 05/29/2017  . Dementia (HCC) 01/01/2018  . Dysphagia 01/01/2018  . Gastroesophageal reflux disease   . Hypertension   . Macular degeneration   . Myocardial infarct (HCC)   . Urinary incontinence   . Venous stasis    Past Surgical History:  Procedure Laterality Date  . CORONARY ANGIOPLASTY WITH STENT PLACEMENT      Allergies  Allergen Reactions  . Erythromycin   . Penicillins     Outpatient Encounter Medications as of 10/27/2018  Medication Sig  . Baclofen 5 MG TABS Take 5 mg by mouth 3 (three) times daily.  Marland Kitchen acetaminophen (TYLENOL) 325 MG tablet Take 650 mg by mouth 2 (two) times daily. And q 8 hrs prn  . amLODipine (NORVASC) 5 MG tablet Take 5 mg by mouth daily.  Marland Kitchen antiseptic oral rinse (BIOTENE) LIQD 15 mLs by Mouth Rinse route 2 (two) times daily.  Marland Kitchen aspirin EC 81 MG tablet Take 81 mg by mouth every 3 (three) days.  Marland Kitchen atenolol (TENORMIN) 25 MG tablet Take by mouth 2 (two) times daily.  . Multiple Vitamins-Minerals (CENTRUM SILVER ULTRA WOMENS) TABS Take 1 tablet by mouth daily.  . multivitamin-lutein (OCUVITE-LUTEIN) CAPS capsule Take 1 capsule by mouth daily.  . Protein (UNJURY PO) Take 0.5 Scoops by mouth 2 (two) times daily.  Marland Kitchen  senna (SENOKOT) 176 MG/5ML SYRP Take 10 mLs by mouth daily.  . traMADol (ULTRAM) 50 MG tablet Take 50 mg by mouth every 6 (six) hours as needed (prior to dressing change).   No facility-administered encounter medications on file as of 10/27/2018.     Review of Systems  Unable to perform ROS: Dementia    Immunization History  Administered Date(s) Administered  . Influenza, High Dose Seasonal PF 01/04/2018  . Influenza,inj,Quad PF,6+ Mos 10/21/2018  . Influenza-Unspecified 09/30/2017  . Pneumococcal Conjugate-13 05/25/2018   Pertinent  Health Maintenance Due  Topic Date Due    . INFLUENZA VACCINE  07/31/2018  . DEXA SCAN  11/27/2018 (Originally [birth year]/1987)  . PNA vac Low Risk Adult  Completed   Fall Risk  04/09/2018 02/18/2018 01/01/2018  Falls in the past year? Yes Yes No  Number falls in past yr: - 1 -  Injury with Fall? - No -   Functional Status Survey:    Vitals:   10/27/18 1018  BP: 131/68  Pulse: 68  Resp: 15  Temp: 98.1 F (36.7 C)  SpO2: 96%   There is no height or weight on file to calculate BMI. Physical Exam  Constitutional: No distress.  HENT:  Head: Normocephalic and atraumatic.  Mouth/Throat: No oropharyngeal exudate.  Dry mouth  Neck: No JVD present.  Cardiovascular: Normal rate and regular rhythm.  No murmur heard. Pulmonary/Chest: Effort normal and breath sounds normal. No respiratory distress. She has no wheezes.  Abdominal: Soft. Bowel sounds are normal.  Musculoskeletal:  Decreased ROM to the right with pain on PROM  Neurological: She is alert.  Oriented to self only.  Skin: Skin is warm and dry. She is not diaphoretic. There is erythema (right 1st and second toe to the midfoot).  Wound between right great toe and 2nd toe with 100% wound base, tenderness and surrounding erythema.   Psychiatric: She has a normal mood and affect.  Nursing note and vitals reviewed.   Labs reviewed: Recent Labs    04/04/18 0600  NA 137  K 4.9  BUN 22*  CREATININE 1.1   No results for input(s): AST, ALT, ALKPHOS, BILITOT, PROT, ALBUMIN in the last 8760 hours. Recent Labs    04/04/18 0600  WBC 6.3  HGB 13.1  HCT 39  PLT 189   Lab Results  Component Value Date   TSH 3.62 04/25/2018   No results found for: HGBA1C No results found for: CHOL, HDL, LDLCALC, LDLDIRECT, TRIG, CHOLHDL  Significant Diagnostic Results in last 30 days:  No results found.  Assessment/Plan  1. Contracture of right knee Slightly improved with baclofen 5 mg tid but continues with pain to the knee and foot. Roxanol 5 mg q 6 hrs prn pain. OT to  implement supportive brace to right knee when in bed for pain management.   2. Cellulitis of second toe of right foot Mild erythema and swelling.  Previously treated with antibiotic therapy but given her goals of care will try to avoid oral antibiotics and use silver dressings.  Wound care nurse to follow.   3. Oropharyngeal dysphagia Difficulty with pills and decline in cognition associated with dementia. Will discontinue all non essential meds: norvasc, aspirin, vitamins, and prn ultram    Family/ staff Communication: staff/resident  Labs/tests ordered:  NA

## 2018-11-03 ENCOUNTER — Encounter: Payer: Self-pay | Admitting: Adult Health

## 2018-11-03 ENCOUNTER — Non-Acute Institutional Stay (SKILLED_NURSING_FACILITY): Payer: Medicare Other | Admitting: Adult Health

## 2018-11-03 DIAGNOSIS — M24561 Contracture, right knee: Secondary | ICD-10-CM | POA: Diagnosis not present

## 2018-11-03 DIAGNOSIS — R1310 Dysphagia, unspecified: Secondary | ICD-10-CM | POA: Diagnosis not present

## 2018-11-03 DIAGNOSIS — R063 Periodic breathing: Secondary | ICD-10-CM | POA: Diagnosis not present

## 2018-11-03 NOTE — Progress Notes (Signed)
Location:  Medical illustrator of Service:  SNF (31) Provider:  Peggye Ley, ANP [facility] [phone]   Kermit Balo, DO  Patient Care Team: Kermit Balo, DO as PCP - General (Geriatric Medicine)  Extended Emergency Contact Information Primary Emergency Contact: Stoll,Dan Address: [address]          [place], [place] [address] Darden Amber of [place] Phone: [phone] Relation: Lucila [place] Secondary Emergency Contact: Trea, [place] Mobile Phone: [phone] Relation: Other  Code Status:  DNR Goals of care: Advanced Directive information Advanced Directives 09/30/2018  Does Patient Have a Medical Advance Directive? Yes  Type of Estate agent of [place];Living will;Out of facility DNR (pink MOST or yellow form)  Does patient want to make changes to medical advance directive? No - Patient declined  Copy of Healthcare Power of Attorney in Chart? Yes  Pre-existing out of facility DNR order (yellow form or pink MOST form) Yellow form placed in chart (order not valid for inpatient use);Pink MOST form placed in chart (order not valid for inpatient use)     Chief Complaint  Patient presents with  . Acute Visit    breathing issue, pain, difficulty swallowing    HPI:  Pt is a 84 y.o. female seen today for an acute visit for breathing difficulties, pain, and difficulty swallowing. She has a hx of vascular dementia with a fairly rapid decline. Her goals of care are comfort based. She has a wound to her right foot between the great toe and 2nd toe being treated with silver dressing that has been painful per the staff. She also has a contracture to the right knee that produces pain but has seen some improvement with baclofen. The nurse noted on 11/3 that she is having Cheyne stokes breathing and appears uncomfortable. She is also having trouble swallowing pills and not eating. She seems to smile and look up  and to the left and appears in a trance.   Past Medical History:  Diagnosis Date  . Coronary artery disease involving native coronary artery of native heart without angina pectoris 05/29/2017  . Dementia (HCC) 01/01/2018  . Dysphagia 01/01/2018  . Gastroesophageal reflux disease   . Hypertension   . Macular degeneration   . Myocardial infarct (HCC)   . Urinary incontinence   . Venous stasis    Past Surgical History:  Procedure Laterality Date  . CORONARY ANGIOPLASTY WITH STENT PLACEMENT      Allergies  Allergen Reactions  . Erythromycin   . Penicillins     Outpatient Encounter Medications as of 11/03/2018  Medication Sig  . acetaminophen (TYLENOL) 325 MG tablet Take 650 mg by mouth 2 (two) times daily. And q 8 hrs prn  . amLODipine (NORVASC) 5 MG tablet Take 5 mg by mouth daily.  Marland Kitchen antiseptic oral rinse (BIOTENE) LIQD 15 mLs by Mouth Rinse route 2 (two) times daily.  Marland Kitchen aspirin EC 81 MG tablet Take 81 mg by mouth every 3 (three) days.  Marland Kitchen atenolol (TENORMIN) 25 MG tablet Take by mouth 2 (two) times daily.  . Baclofen 5 MG TABS Take 5 mg by mouth 3 (three) times daily.  . Multiple Vitamins-Minerals (CENTRUM SILVER ULTRA WOMENS) TABS Take 1 tablet by mouth daily.  . multivitamin-lutein (OCUVITE-LUTEIN) CAPS capsule Take 1 capsule by mouth daily.  . Protein (UNJURY PO) Take 0.5 Scoops by mouth 2 (two) times daily.  Marland Kitchen senna (SENOKOT) 176 MG/5ML SYRP Take 10 mLs by mouth daily.  Marland Kitchen  traMADol (ULTRAM) 50 MG tablet Take 50 mg by mouth every 6 (six) hours as needed (prior to dressing change).   No facility-administered encounter medications on file as of 11/03/2018.     Review of Systems  Unable to perform ROS: Acuity of condition    Immunization History  Administered Date(s) Administered  . Influenza, High Dose Seasonal PF 01/04/2018  . Influenza,inj,Quad PF,6+ Mos 10/21/2018  . Influenza-Unspecified 09/30/2017  . Pneumococcal Conjugate-13 05/25/2018   Pertinent  Health  Maintenance Due  Topic Date Due  . DEXA SCAN  11/27/2018 (Originally 09[birth year]11[birth year]1987)  . INFLUENZA VACCINE  Completed  . PNA vac Low Risk Adult  Completed   Fall Risk  04/09/2018 02/18/2018 01/01/2018  Falls in the past year? Yes Yes No  Number falls in past yr: - 1 -  Injury with Fall? - No -   Functional Status Survey:    Vitals:   11/03/18 1627  BP: (!) 152/83  Pulse: 88  Resp: 18  Temp: 97.7 F (36.5 C)  SpO2: 96%   There is no height or weight on file to calculate BMI. Physical Exam  Constitutional: No distress.  HENT:  Very dry mouth  Neck: No JVD present.  Cardiovascular: Normal rate and regular rhythm.  Pulmonary/Chest: No stridor. No respiratory distress. She has no wheezes. She has no rales.  Bilateral rhonchi with Cheyne stokes breathing pattern  Abdominal: Soft. Bowel sounds are normal. She exhibits no distension. There is no tenderness.  Lymphadenopathy:    She has no cervical adenopathy.  Neurological: She is alert.  Not able to follow commands, not verbal. Looks up and to the left and occasionally looks and smiles  Skin: Skin is warm and dry. She is not diaphoretic.  Right foot wound between great toe and second toe is improved and closing with less erythema  Nursing note and vitals reviewed.   Labs reviewed: Recent Labs    04/04/18 0600  NA 137  K 4.9  BUN 22*  CREATININE 1.1   No results for input(s): AST, ALT, ALKPHOS, BILITOT, PROT, ALBUMIN in the last 8760 hours. Recent Labs    04/04/18 0600  WBC 6.3  HGB 13.1  HCT 39  PLT 189   Lab Results  Component Value Date   TSH 3.62 04/25/2018   No results found for: HGBA1C No results found for: CHOL, HDL, LDLCALC, LDLDIRECT, TRIG, CHOLHDL  Significant Diagnostic Results in last 30 days:  No results found.  Assessment/Plan  1. Cheyne-Stokes breathing Appears more uncomfortable Will increase roxanol to 5 mg q 2 hrs prn pain/sob I have already spoke to her grandson in the past noting that she  was progressing to the end of life and he was notified by the nurse of her abnormal breathing pattern.  Use Dulcolax PRN qd for constipation. Atropine 1% gtts QID prn secretions  2. Contracture of right knee D/C baclofen due to the fact that she is going through the dying process. May continue voltaren gel and roxanol for pain control  3. Dysphagia, unspecified type Difficulty swallowing pills, will discontinue all oral meds and provide mouth care.    Family/ staff Communication: staff to communicate with her grandson  Labs/tests ordered:  NA

## 2018-11-30 DEATH — deceased
